# Patient Record
Sex: Male | Born: 1985
Health system: Southern US, Community
[De-identification: ages and names within clinical notes are randomized; demographics above are authoritative.]

## PROBLEM LIST (undated history)

## (undated) DIAGNOSIS — M272 Inflammatory conditions of jaws: Secondary | ICD-10-CM

## (undated) HISTORY — PX: NO PAST SURGERIES: SHX2092

---

## 2016-12-14 ENCOUNTER — Ambulatory Visit (HOSPITAL_COMMUNITY)
Admission: EM | Admit: 2016-12-14 | Discharge: 2016-12-14 | Disposition: A | Discharge status: Home / Self Care | Payer: Self-pay | Attending: Family Medicine | Admitting: Family Medicine

## 2016-12-14 ENCOUNTER — Encounter (HOSPITAL_COMMUNITY): Payer: Self-pay | Admitting: Emergency Medicine

## 2016-12-14 DIAGNOSIS — H9191 Unspecified hearing loss, right ear: Secondary | ICD-10-CM

## 2016-12-14 DIAGNOSIS — H938X1 Other specified disorders of right ear: Secondary | ICD-10-CM

## 2016-12-14 NOTE — Discharge Instructions (Signed)
The cause of your symptoms is not immediately clear. This may be due to dysfunction of the mechanical or nervous structures of the inner ear. This can only be properly evaluated by a primary care physician or ENT specialist. At your visit today there is no evidence of infection, fluid buildup, or ear wax in your ear. Please contact community health and wellness for a follow-up appointment.

## 2016-12-14 NOTE — ED Triage Notes (Signed)
Reports unable to hear in right ear for 2 months .reports a trip to [place] and noticing popping in ear, but since arriving back home -unable to hear in right ear

## 2016-12-14 NOTE — ED Provider Notes (Signed)
[facility]    CSN: [id number] Arrival date & time: 12/14/16  1422     History   Chief Complaint Chief Complaint  Patient presents with  . Ear Fullness    HPI Robert Kent is a 31 y.o. male.   HPI  Right ear fullness and diminished ability to hear. Constant. Getting worse. Present for 2-3 months. Nothing makes it better. Nothing makes it worse. Denies any pain, dizziness, nausea, vomiting, headache, other focal neurological deficit, neck stiffness, chest pain, palpitations, headache. No previous history of injury to the ear. No previous surgeries. No family history of deafness.    History reviewed. No pertinent past medical history.  There are no active problems to display for this patient.   History reviewed. No pertinent surgical history.     Home Medications    Prior to Admission medications   Not on File    Family History No family history on file.  Social History Social History  Substance Use Topics  . Smoking status: Current Every Day Smoker  . Smokeless tobacco: Not on file  . Alcohol use Yes     Allergies   Patient has no known allergies.   Review of Systems Review of Systems Per HPI with all other pertinent systems negative.    Physical Exam Triage Vital Signs ED Triage Vitals [12/14/16 1449]  Enc Vitals Group     BP 119/67     Pulse Rate 60     Resp 16     Temp 98.4 F (36.9 C)     Temp Source Oral     SpO2 97 %     Weight      Height      Head Circumference      Peak Flow      Pain Score      Pain Loc      Pain Edu?      Excl. in GC?    No data found.   Updated Vital Signs BP 119/67 (BP Location: Right Arm)   Pulse 60   Temp 98.4 F (36.9 C) (Oral)   Resp 16   SpO2 97%   Visual Acuity Right Eye Distance:   Left Eye Distance:   Bilateral Distance:    Right Eye Near:   Left Eye Near:    Bilateral Near:     Physical Exam  Physical Exam  Constitutional: oriented to person, place, and time.  appears well-developed and well-nourished. No distress.  HENT:  Head: Normocephalic and atraumatic.  tympanic membranes normal bilaterally. No effusions. Eyes: EOMI. PERRL.  Neck: Normal range of motion.  Cardiovascular: RRR, no m/r/g, 2+ distal pulses,  Pulmonary/Chest: Effort normal and breath sounds normal. No respiratory distress.  Abdominal: Soft. Bowel sounds are normal. NonTTP, no distension.  Musculoskeletal: Normal range of motion. Non ttp, no effusion.  Neurological: alert and oriented to person, place, and time.  attempted simple scratch test while patient's eyes were closed. Hearing slightly worse on the right on the left. Skin: Skin is warm. No rash noted. non diaphoretic.  Psychiatric: normal mood and affect. behavior is normal. Judgment and thought content normal.   UC Treatments / Results  Labs (all labs ordered are listed, but only abnormal results are displayed) Labs Reviewed - No data to display  EKG  EKG Interpretation None       Radiology No results found.  Procedures Procedures (including critical care time)  Medications Ordered in UC Medications - No data to display  Initial Impression / Assessment and Plan / UC Course  I have reviewed the triage vital signs and the nursing notes.  Pertinent labs & imaging results that were available during my care of the patient were reviewed by me and considered in my medical decision making (see chart for details).     Ear fullness and loss of hearing in the absence of infection, fluid buildup, or cerumen impaction concerning for mechanical or nervous system dysfunction. Unfortunately we do not have the capabilities of testing patient's hearing in order to get objective findings. Patient will need further testing other new PCPs office [facility] Works with social work to get patient financial assistance as he has no insurance. Patient will follow up at [facility] wellness and will hopefully be [facility]  patient to ENT for further testing if needed.  Final Clinical Impressions(s) / UC Diagnoses   Final diagnoses:  None    New Prescriptions New Prescriptions   No medications on file     Ozella Rocksavid J Merrell, MD 12/14/16 1526

## 2018-08-07 ENCOUNTER — Other Ambulatory Visit: Payer: Self-pay

## 2018-08-07 ENCOUNTER — Ambulatory Visit (HOSPITAL_COMMUNITY)
Admission: EM | Admit: 2018-08-07 | Discharge: 2018-08-07 | Disposition: A | Discharge status: Home / Self Care | Payer: Self-pay

## 2018-08-07 ENCOUNTER — Encounter (HOSPITAL_COMMUNITY): Payer: Self-pay | Admitting: Emergency Medicine

## 2018-08-07 ENCOUNTER — Inpatient Hospital Stay (HOSPITAL_COMMUNITY)
Admission: EM | Admit: 2018-08-07 | Discharge: 2018-08-10 | DRG: 158 | Disposition: A | Discharge status: Home / Self Care | Payer: Self-pay | Attending: Internal Medicine | Admitting: Internal Medicine

## 2018-08-07 ENCOUNTER — Encounter (HOSPITAL_COMMUNITY): Payer: Self-pay | Admitting: *Deleted

## 2018-08-07 ENCOUNTER — Emergency Department (HOSPITAL_COMMUNITY): Payer: Self-pay

## 2018-08-07 DIAGNOSIS — M272 Inflammatory conditions of jaws: Secondary | ICD-10-CM

## 2018-08-07 DIAGNOSIS — K0401 Reversible pulpitis: Secondary | ICD-10-CM | POA: Diagnosis present

## 2018-08-07 DIAGNOSIS — K122 Cellulitis and abscess of mouth: Secondary | ICD-10-CM | POA: Diagnosis present

## 2018-08-07 DIAGNOSIS — H9191 Unspecified hearing loss, right ear: Secondary | ICD-10-CM | POA: Diagnosis present

## 2018-08-07 DIAGNOSIS — K047 Periapical abscess without sinus: Principal | ICD-10-CM | POA: Diagnosis present

## 2018-08-07 DIAGNOSIS — F17210 Nicotine dependence, cigarettes, uncomplicated: Secondary | ICD-10-CM | POA: Diagnosis present

## 2018-08-07 HISTORY — DX: Inflammatory conditions of jaws: M27.2

## 2018-08-07 LAB — I-STAT CHEM 8, ED
BUN: 11 mg/dL (ref 6–20)
CREATININE: 0.9 mg/dL (ref 0.61–1.24)
Calcium, Ion: 1.2 mmol/L (ref 1.15–1.40)
Chloride: 98 mmol/L (ref 98–111)
Glucose, Bld: 113 mg/dL — ABNORMAL HIGH (ref 70–99)
HEMATOCRIT: 47 % (ref 39.0–52.0)
HEMOGLOBIN: 16 g/dL (ref 13.0–17.0)
Potassium: 3.8 mmol/L (ref 3.5–5.1)
Sodium: 137 mmol/L (ref 135–145)
TCO2: 29 mmol/L (ref 22–32)

## 2018-08-07 LAB — COMPREHENSIVE METABOLIC PANEL
ALT: 17 U/L (ref 0–44)
AST: 18 U/L (ref 15–41)
Albumin: 4.6 g/dL (ref 3.5–5.0)
Alkaline Phosphatase: 45 U/L (ref 38–126)
Anion gap: 11 (ref 5–15)
BUN: 10 mg/dL (ref 6–20)
CHLORIDE: 100 mmol/L (ref 98–111)
CO2: 25 mmol/L (ref 22–32)
Calcium: 9.6 mg/dL (ref 8.9–10.3)
Creatinine, Ser: 0.96 mg/dL (ref 0.61–1.24)
Glucose, Bld: 113 mg/dL — ABNORMAL HIGH (ref 70–99)
POTASSIUM: 3.8 mmol/L (ref 3.5–5.1)
Sodium: 136 mmol/L (ref 135–145)
Total Bilirubin: 1.5 mg/dL — ABNORMAL HIGH (ref 0.3–1.2)
Total Protein: 7.9 g/dL (ref 6.5–8.1)

## 2018-08-07 LAB — CBC WITH DIFFERENTIAL/PLATELET
Abs Immature Granulocytes: 0.05 10*3/uL (ref 0.00–0.07)
Basophils Absolute: 0 10*3/uL (ref 0.0–0.1)
Basophils Relative: 0 %
EOS ABS: 0.1 10*3/uL (ref 0.0–0.5)
EOS PCT: 1 %
HEMATOCRIT: 45.2 % (ref 39.0–52.0)
Hemoglobin: 13.7 g/dL (ref 13.0–17.0)
Immature Granulocytes: 0 %
LYMPHS ABS: 1.8 10*3/uL (ref 0.7–4.0)
Lymphocytes Relative: 15 %
MCH: 24.8 pg — AB (ref 26.0–34.0)
MCHC: 30.3 g/dL (ref 30.0–36.0)
MCV: 81.9 fL (ref 80.0–100.0)
Monocytes Absolute: 1.1 10*3/uL — ABNORMAL HIGH (ref 0.1–1.0)
Monocytes Relative: 9 %
NRBC: 0 % (ref 0.0–0.2)
Neutro Abs: 8.8 10*3/uL — ABNORMAL HIGH (ref 1.7–7.7)
Neutrophils Relative %: 75 %
PLATELETS: 108 10*3/uL — AB (ref 150–400)
RBC: 5.52 MIL/uL (ref 4.22–5.81)
RDW: 13.2 % (ref 11.5–15.5)
WBC: 11.9 10*3/uL — ABNORMAL HIGH (ref 4.0–10.5)

## 2018-08-07 LAB — INFLUENZA PANEL BY PCR (TYPE A & B)
INFLAPCR: NEGATIVE
INFLBPCR: NEGATIVE

## 2018-08-07 LAB — GROUP A STREP BY PCR: Group A Strep by PCR: NOT DETECTED

## 2018-08-07 LAB — I-STAT CG4 LACTIC ACID, ED: Lactic Acid, Venous: 1.02 mmol/L (ref 0.5–1.9)

## 2018-08-07 MED ORDER — IOHEXOL 300 MG/ML  SOLN
75.0000 mL | Freq: Once | INTRAMUSCULAR | Status: AC | PRN
  Administered 2018-08-07: 100 mL via INTRAVENOUS

## 2018-08-07 MED ORDER — LACTATED RINGERS IV SOLN
INTRAVENOUS | Status: DC
  Administered 2018-08-07 – 2018-08-09 (×4): via INTRAVENOUS

## 2018-08-07 MED ORDER — ONDANSETRON HCL 4 MG PO TABS: 4.0000 mg | ORAL_TABLET | Freq: Four times a day (QID) | ORAL | Status: DC | PRN

## 2018-08-07 MED ORDER — CLINDAMYCIN PHOSPHATE 600 MG/50ML IV SOLN
600.0000 mg | Freq: Four times a day (QID) | INTRAVENOUS | Status: DC
  Administered 2018-08-07 – 2018-08-10 (×11): 600 mg via INTRAVENOUS
  Filled 2018-08-07 (×14): qty 50

## 2018-08-07 MED ORDER — DEXAMETHASONE SODIUM PHOSPHATE 10 MG/ML IJ SOLN
10.0000 mg | Freq: Once | INTRAMUSCULAR | Status: AC
  Administered 2018-08-07: 10 mg via INTRAVENOUS
  Filled 2018-08-07: qty 1

## 2018-08-07 MED ORDER — ONDANSETRON HCL 4 MG/2ML IJ SOLN
4.0000 mg | Freq: Four times a day (QID) | INTRAMUSCULAR | Status: DC | PRN
  Administered 2018-08-09: 4 mg via INTRAVENOUS

## 2018-08-07 MED ORDER — ACETAMINOPHEN 325 MG PO TABS
650.0000 mg | ORAL_TABLET | Freq: Four times a day (QID) | ORAL | Status: DC | PRN
  Administered 2018-08-08 (×2): 650 mg via ORAL
  Filled 2018-08-07 (×2): qty 2

## 2018-08-07 MED ORDER — DEXAMETHASONE SODIUM PHOSPHATE 10 MG/ML IJ SOLN
10.0000 mg | Freq: Two times a day (BID) | INTRAMUSCULAR | Status: DC
  Administered 2018-08-07 – 2018-08-10 (×6): 10 mg via INTRAVENOUS
  Filled 2018-08-07 (×5): qty 1

## 2018-08-07 MED ORDER — MORPHINE SULFATE (PF) 2 MG/ML IV SOLN
2.0000 mg | INTRAVENOUS | Status: DC | PRN
  Administered 2018-08-08: 2 mg via INTRAVENOUS
  Filled 2018-08-07: qty 1

## 2018-08-07 MED ORDER — CLINDAMYCIN PHOSPHATE 600 MG/50ML IV SOLN
600.0000 mg | Freq: Once | INTRAVENOUS | Status: AC
  Administered 2018-08-07: 600 mg via INTRAVENOUS
  Filled 2018-08-07: qty 50

## 2018-08-07 MED ORDER — SODIUM CHLORIDE 0.9 % IV BOLUS
1000.0000 mL | Freq: Once | INTRAVENOUS | Status: AC
  Administered 2018-08-07: 1000 mL via INTRAVENOUS

## 2018-08-07 MED ORDER — KETOROLAC TROMETHAMINE 30 MG/ML IJ SOLN
30.0000 mg | Freq: Once | INTRAMUSCULAR | Status: AC
  Administered 2018-08-07: 30 mg via INTRAVENOUS
  Filled 2018-08-07: qty 1

## 2018-08-07 MED ORDER — ACETAMINOPHEN 650 MG RE SUPP: 650.0000 mg | Freq: Four times a day (QID) | RECTAL | Status: DC | PRN

## 2018-08-07 NOTE — ED Triage Notes (Signed)
Pt in c/o right lower jaw that started yesterday, pt reports difficulty maintaining his saliva and is spitting into a tissue, no distress noted

## 2018-08-07 NOTE — H&P (Signed)
History and Physical    Robert Kent ZOX:[id number] DOB: [birth year] DOA: 08/07/2018  PCP: Patient, No Pcp Per - last saw a doctor at [facility] >1 year ago Consultants:  None Patient coming from: Home - lives with parents and brother; NO[place], [phone]  Chief Complaint: Jaw pain  HPI: Robert Kent is a 32 y.o. male with no significant past medical history presenting with jaw pain.  He noticed right > left jaw pain yesterday.  He was perfectly fine prior.  No dental pain or swelling prior to yesterday.  He is currently unable to eat or drink due to the swelling and trismus.  No fever.  He has never had this issue before.   Last saw a dentist >5-6 years ago.  ED Course:  Sudden onset R face/neck swelling, trismus.  He is a refugee, uncertain of immunization status and so some concern for mumps - no longer worried about this.  He has jaw abscesses - given Decadron and Clindamycin.  Dr. Teola Bradley (oral surgery) to see.  Given Toradol for pain.  Review of Systems: As per HPI; otherwise review of systems reviewed and negative.   Ambulatory Status: Ambulates without assistance  History reviewed. No pertinent past medical history.  Past Surgical History:  Procedure Laterality Date  . NO PAST SURGERIES      Social History   Socioeconomic History  . Marital status: Single    Spouse name: Not on file  . Number of children: Not on file  . Years of education: Not on file  . Highest education level: Not on file  Occupational History  . Occupation: Management consultant Needs  . Financial resource strain: Not on file  . Food insecurity:    Worry: Not on file    Inability: Not on file  . Transportation needs:    Medical: Not on file    Non-medical: Not on file  Tobacco Use  . Smoking status: Current Every Day Smoker    Packs/day: 0.25    Years: 10.00    Pack years: 2.50    Types: Cigarettes  . Smokeless tobacco: Never Used  Substance and Sexual Activity  . Alcohol use: Yes   Comment: no daily drinking, drinks a few times a month  . Drug use: No  . Sexual activity: Not on file  Lifestyle  . Physical activity:    Days per week: Not on file    Minutes per session: Not on file  . Stress: Not on file  Relationships  . Social connections:    Talks on phone: Not on file    Gets together: Not on file    Attends religious service: Not on file    Active member of club or organization: Not on file    Attends meetings of clubs or organizations: Not on file    Relationship status: Not on file  . Intimate partner violence:    Fear of current or ex partner: Not on file    Emotionally abused: Not on file    Physically abused: Not on file    Forced sexual activity: Not on file  Other Topics Concern  . Not on file  Social History Narrative  . Not on file    No Known Allergies  Family History  Family history unknown: Yes    Prior to Admission medications   Medication Sig Start Date End Date Taking? Authorizing Provider  acetaminophen (TYLENOL) 500 MG tablet Take 1,000 mg by mouth every 6 (six) hours  as needed for mild pain.   Yes [provider]    Physical Exam: Vitals:   08/07/18 1338  BP: 105/65  Pulse: 61  Resp: 16  Temp: 97.8 F (36.6 C)  TempSrc: Oral  SpO2: 100%     General: Appears calm and comfortable and is NAD Eyes:  PERRL, EOMI, normal lids, iris ENT: left mandibular edema without erythema, significant TTP, shotty submandibular LAD; poor dentition.  He has trismus, with difficulty opening his mouth  Neck:  no LAD, masses or thyromegaly Cardiovascular:  RRR, no m/r/g. No LE edema.  Respiratory:   CTA bilaterally with no wheezes/rales/rhonchi.  Normal respiratory effort. Abdomen:  soft, NT, ND, NABS Skin:  no rash or induration seen on limited exam Musculoskeletal:  grossly normal tone BUE/BLE, good ROM, no bony abnormality Psychiatric:  grossly normal mood and affect, speech fluent and appropriate, AOx3 Neurologic:  CN 2-12  grossly intact, moves all extremities in coordinated fashion, sensation intact    Radiological Exams on Admission: Ct Soft Tissue Neck W Contrast  Result Date: 08/07/2018 CLINICAL DATA:  Pain and swelling in the right side of the neck. Symptoms began yesterday. EXAM: CT NECK WITH CONTRAST TECHNIQUE: Multidetector CT imaging of the neck was performed using the standard protocol following the bolus administration of intravenous contrast. CONTRAST:  OMNIPAQUE IOHEXOL 300 MG/ML  SOLN COMPARISON:  None. FINDINGS: Pharynx and larynx: No focal mucosal or submucosal lesions are present. Nasopharynx is within normal limits. Soft palate and tongue base are normal. Palatine tonsils are within normal limits. Epiglottis is normal. Hypopharynx is unremarkable. Vocal cords are midline and symmetric. Salivary glands: Edematous changes are present about the submandibular glands bilaterally, right greater than left. There is no duct obstruction or mass lesion. The parotid glands are unremarkable. Thyroid: Normal Lymph nodes: Right greater than left submandibular and level 2 adenopathy is reactive. No necrotic nodes are present. Vascular: Negative. Limited intracranial: Within normal limits. Mastoids and visualized paranasal sinuses: Circumferential mucosal thickening in the inferior maxillary sinuses is worse on the left. Paranasal sinuses are otherwise not imaged. Mastoid air cells are clear. Skeleton: Vertebral body heights alignment are maintained. No focal lytic or blastic lesions are present. Periapical lucencies are present about the medial incisors bilaterally in the mandible. There is a periapical lucency about the right lateral incisor with destruction of the cortex posterior to the apex of tooth 26. A subperiosteal abscess extends laterally and posteriorly from the apex of tooth 26 and the cortical breach. The subperiosteal abscess measures 17 x 18 x 5 mm. Upper chest: The lung apices are clear.  An azygos  fissure is noted. Other: Marked edematous changes are present in the right submandibular space displacing the mylohyoid medially. There is extensive subcutaneous edema and thickening of the platysma. The right sublingual gland is displaced medially. IMPRESSION: 1. Periapical abscesses involving 3 of the mandibular incisors. Please see discussion above. 2. Posterior cortical destruction and associated subperiosteal abscess extending along the medial aspect of the mandible anteriorly with extensive associated inflammatory changes below the floor of the mouth on the right and into the right submandibular space. 3. Inflammatory changes into the right neck of odontogenic etiology. 4. Reactive submandibular and level 2 adenopathy, right greater than left. Electronically Signed   By: Marin Roberts M.D.   On: 08/07/2018 12:25    EKG: not done   Labs on Admission: I have personally reviewed the available labs and imaging studies at the time of the admission.  Pertinent labs:  Glucose 113 Bili 1.5 CMP otherwise WNL WBC 11.9 Platelets 108  Assessment/Plan Principal Problem:   Acute periapical abscess   -Otherwise healthy refugee presenting with acute onset of R>L mandibular swelling and pain -Imaging indicates periapical abscesses with cortical destruction and extensive inflammatory changes, likely associated with poor dentition -Given the patient's significant pain, swelling, and trismus, he is unable to take any PO  -Will leave NPO for now and hydrate with IVF -Dr. Barbette Merino from oral surgery will consult on the patient this afternoon to determine what additional treatment is needed -For now, continue IV Clindamycin 600 mg q6h and 10 mg IV Decadron BID -Dr. Doran Heater (ENT) was consulted by telephone by the EDP but there does not appear to be a current role for ENT given his intact airway -Pain control with morphine -Will observe for now, but depending on the extent of treatment needed, he  may need to move to inpatient status   DVT prophylaxis: Early ambulation Code Status:  Full  Family Communication: Friend present during evaluation  Disposition Plan:  Home once clinically improved Consults called: Oral surgery; ENT (telephone only)  Admission status: It is my clinical opinion that referral for OBSERVATION is reasonable and necessary in this patient based on the above information provided. The aforementioned taken together are felt to place the patient at high risk for further clinical deterioration. However it is anticipated that the patient may be medically stable for discharge from the hospital within 24 to 48 hours.    Jonah Blue MD [facility]  If note is complete, please contact covering daytime or nighttime physician. [email or web address] Password [email or web address]  08/07/2018, 4:18 PM

## 2018-08-07 NOTE — ED Provider Notes (Signed)
[facility] EMERGENCY DEPARTMENT Provider Note   CSN: [id number] Arrival date & time: 08/07/18  1014     History   Chief Complaint Chief Complaint  Patient presents with  . Facial Swelling    HPI Robert Kent is a 32 y.o. male with no pertinent PMH who presents to the Emergency Department with a chief complaint of right-sided facial and neck swelling.   Patient endorses constant, rapidly worsening right-sided facial and neck swelling, onset yesterday with associated muffled voice and trismus.  He states that he feels as if he is having some difficulty breathing and starting to feel as if his throat is closing.  He reports associated subjective fever and chills.  He denies sore throat, otalgia, nasal congestion, nominal pain, nausea, vomiting, diarrhea, or dental pain.  Pain is worse with swallowing.  No alleviating factors.  Reports he has lived in a refugee camp in the [place] for the last 18 years.  He is originally from [place]. He is unsure of his vaccination status.  He states that his father was ill with similar symptoms about 1.5 months ago.  He is unsure of his father's final diagnoses, but he knows that he had an infection.  The history is provided by the patient. A language interpreter was used ([place]).    History reviewed. No pertinent past medical history.  There are no active problems to display for this patient.   Past Surgical History:  Procedure Laterality Date  . NO PAST SURGERIES        Home Medications    Prior to Admission medications   Medication Sig Start Date End Date Taking? Authorizing Provider  acetaminophen (TYLENOL) 500 MG tablet Take 1,000 mg by mouth every 6 (six) hours as needed for mild pain.   Yes [provider]    Family History Family History  Family history unknown: Yes    Social History Social History   Tobacco Use  . Smoking status: Current Every Day Smoker  Substance Use Topics  . Alcohol use:  Yes  . Drug use: No     Allergies   Patient has no known allergies.   Review of Systems Review of Systems  Constitutional: Positive for chills and fever. Negative for appetite change.  HENT: Positive for facial swelling, sore throat, trouble swallowing and voice change. Negative for congestion, dental problem, ear pain, postnasal drip, sinus pressure and sinus pain.   Respiratory: Negative for shortness of breath.   Cardiovascular: Negative for chest pain.  Gastrointestinal: Negative for abdominal pain, diarrhea, nausea and vomiting.  Genitourinary: Negative for dysuria.  Musculoskeletal: Negative for back pain.  Skin: Negative for rash.  Allergic/Immunologic: Negative for immunocompromised state.  Neurological: Negative for dizziness, weakness, numbness and headaches.  Psychiatric/Behavioral: Negative for confusion.   Physical Exam Updated Vital Signs BP 105/65   Pulse 61   Temp 97.8 F (36.6 C) (Oral)   Resp 16   SpO2 100%   Physical Exam  Constitutional: He appears well-developed.  HENT:  Head: Normocephalic and atraumatic.  Right Ear: External ear normal.  Left Ear: External ear normal.  Mouth/Throat: There is trismus in the jaw.  Significant sublingual induration and swelling. TTP to the sublingual area. Right-sided swelling to the right lateral superior neck and mandible. Posterior oropharynx is unable to be visualized due to swelling. No tri-podding. No edema to the mandibular gingiva.   Eyes: Conjunctivae are normal.  Neck: Neck supple.  Cardiovascular: Normal rate, regular rhythm, normal heart sounds  and intact distal pulses. Exam reveals no gallop and no friction rub.  No murmur heard. Pulmonary/Chest: Effort normal. No stridor. No respiratory distress. He has no wheezes. He has no rales. He exhibits no tenderness.  Abdominal: Soft. He exhibits no distension.  Neurological: He is alert.  Skin: Skin is warm and dry.  Psychiatric: His behavior is normal.    Nursing note and vitals reviewed.  ED Treatments / Results  Labs (all labs ordered are listed, but only abnormal results are displayed) Labs Reviewed  CBC WITH DIFFERENTIAL/PLATELET - Abnormal; Notable for the following components:      Result Value   WBC 11.9 (*)    MCH 24.8 (*)    Platelets 108 (*)    Neutro Abs 8.8 (*)    Monocytes Absolute 1.1 (*)    All other components within normal limits  COMPREHENSIVE METABOLIC PANEL - Abnormal; Notable for the following components:   Glucose, Bld 113 (*)    Total Bilirubin 1.5 (*)    All other components within normal limits  I-STAT CHEM 8, ED - Abnormal; Notable for the following components:   Glucose, Bld 113 (*)    All other components within normal limits  GROUP A STREP BY PCR  INFLUENZA PANEL BY PCR (TYPE A & B)  I-STAT CG4 LACTIC ACID, ED  I-STAT CG4 LACTIC ACID, ED    EKG None  Radiology Ct Soft Tissue Neck W Contrast  Result Date: 08/07/2018 CLINICAL DATA:  Pain and swelling in the right side of the neck. Symptoms began yesterday. EXAM: CT NECK WITH CONTRAST TECHNIQUE: Multidetector CT imaging of the neck was performed using the standard protocol following the bolus administration of intravenous contrast. CONTRAST:  OMNIPAQUE IOHEXOL 300 MG/ML  SOLN COMPARISON:  None. FINDINGS: Pharynx and larynx: No focal mucosal or submucosal lesions are present. Nasopharynx is within normal limits. Soft palate and tongue base are normal. Palatine tonsils are within normal limits. Epiglottis is normal. Hypopharynx is unremarkable. Vocal cords are midline and symmetric. Salivary glands: Edematous changes are present about the submandibular glands bilaterally, right greater than left. There is no duct obstruction or mass lesion. The parotid glands are unremarkable. Thyroid: Normal Lymph nodes: Right greater than left submandibular and level 2 adenopathy is reactive. No necrotic nodes are present. Vascular: Negative. Limited intracranial:  Within normal limits. Mastoids and visualized paranasal sinuses: Circumferential mucosal thickening in the inferior maxillary sinuses is worse on the left. Paranasal sinuses are otherwise not imaged. Mastoid air cells are clear. Skeleton: Vertebral body heights alignment are maintained. No focal lytic or blastic lesions are present. Periapical lucencies are present about the medial incisors bilaterally in the mandible. There is a periapical lucency about the right lateral incisor with destruction of the cortex posterior to the apex of tooth 26. A subperiosteal abscess extends laterally and posteriorly from the apex of tooth 26 and the cortical breach. The subperiosteal abscess measures 17 x 18 x 5 mm. Upper chest: The lung apices are clear.  An azygos fissure is noted. Other: Marked edematous changes are present in the right submandibular space displacing the mylohyoid medially. There is extensive subcutaneous edema and thickening of the platysma. The right sublingual gland is displaced medially. IMPRESSION: 1. Periapical abscesses involving 3 of the mandibular incisors. Please see discussion above. 2. Posterior cortical destruction and associated subperiosteal abscess extending along the medial aspect of the mandible anteriorly with extensive associated inflammatory changes below the floor of the mouth on the right and into the  right submandibular space. 3. Inflammatory changes into the right neck of odontogenic etiology. 4. Reactive submandibular and level 2 adenopathy, right greater than left. Electronically Signed   By: Marin Roberts M.D.   On: 08/07/2018 12:25    Procedures Procedures (including critical care time)  Medications Ordered in ED Medications  dexamethasone (DECADRON) injection 10 mg (10 mg Intravenous Given 08/07/18 1049)  sodium chloride 0.9 % bolus 1,000 mL (0 mLs Intravenous Stopped 08/07/18 1210)  iohexol (OMNIPAQUE) 300 MG/ML solution 75 mL (100 mLs Intravenous Contrast Given  08/07/18 1153)  clindamycin (CLEOCIN) IVPB 600 mg (600 mg Intravenous New Bag/Given 08/07/18 1336)  ketorolac (TORADOL) 30 MG/ML injection 30 mg (30 mg Intravenous Given 08/07/18 1347)     Initial Impression / Assessment and Plan / ED Course  I have reviewed the triage vital signs and the nursing notes.  Pertinent labs & imaging results that were available during my care of the patient were reviewed by me and considered in my medical decision making (see chart for details).     32 year old male with no pertinent past medical history and questionable vaccination status presenting with right-sided facial and neck swelling, trismus, and muffled voice, onset yesterday.  On exam, posterior oropharynx is able to be visualized at this time secondary to swelling.  There is significant swelling to the sub-lingual region.  Initially, there was concern given the patient's questionable immunization status with unilateral swelling but the patient may have months since the patient's father was ill with unilateral facial swelling in the last 1 to 1-1/2 months.  Spoke with Darral Dash at infection control who recommended an influenza swab and strep test.  If negative, she recommended a buccal swab for mumps PCR.  Since his symptoms are less than 3 days, IgG or IgM is not indicated.  However, after CT scan mumps was much less likely.  Labs are notable for leukocytosis of 12 with neutrophil count of 8.8.  Monocyte count is also 1.1.  Thrombocytopenia of 108.    CT neck with periapical abscesses involving 3 of the mandibular incisors in addition to posterior cortical destruction and associated subperiosteal abscess extending along the medial aspect of the mandible anteriorly with extensive associated inflammatory changes below the floor the mouth on the right and into the right submandibular space and inflammatory changes into the right neck appears to be a odontogenic etiology.  Decadron, Toradol, and IV clindamycin  given in the ED.  The patient was discussed with Dr. Dalene Seltzer, attending physician.  Initially spoke with Dr. Doran Heater, ENT, who does not feel that ENT involvement is needed in this time as the patient does not require intubation.  Spoke with Dr. Laural Benes, oral surgery, who will plan to evaluate the patient in the hospital this afternoon.  Consulted the medical team, and Dr. Ophelia Charter, hospitalist, will admit. The patient appears reasonably stabilized for admission considering the current resources, flow, and capabilities available in the ED at this time, and I doubt any other [facility] requiring further screening and/or treatment in the ED prior to admission.  Final Clinical Impressions(s) / ED Diagnoses   Final diagnoses:  Subperiosteal abscess of jaw    ED Discharge Orders    None       Barkley Boards, PA-C 08/07/18 1518    Alvira Monday, MD 08/07/18 2335

## 2018-08-07 NOTE — ED Notes (Signed)
Traci, np evaluated patient at triage.  Patient going to ed now with family

## 2018-08-07 NOTE — ED Notes (Signed)
Report to 5 C 

## 2018-08-07 NOTE — ED Triage Notes (Addendum)
Swelling started yesterday.  Patient reports difficulty swallowing.  Breathing without difficulty.  Visible swelling to right side of neck and jaw.  Patient having difficulty opening his mouth.  Patient answering appropriately.  Patient having difficulty swallowing saliva, he is spitting saliva in tissue

## 2018-08-08 ENCOUNTER — Observation Stay (HOSPITAL_COMMUNITY): Payer: Self-pay

## 2018-08-08 DIAGNOSIS — K047 Periapical abscess without sinus: Secondary | ICD-10-CM | POA: Diagnosis present

## 2018-08-08 LAB — BASIC METABOLIC PANEL
ANION GAP: 9 (ref 5–15)
BUN: 13 mg/dL (ref 6–20)
CHLORIDE: 107 mmol/L (ref 98–111)
CO2: 24 mmol/L (ref 22–32)
Calcium: 9.2 mg/dL (ref 8.9–10.3)
Creatinine, Ser: 0.73 mg/dL (ref 0.61–1.24)
GFR calc Af Amer: >60 mL/min (ref >60)
GFR calc non Af Amer: >60 mL/min (ref >60)
GLUCOSE: 137 mg/dL — AB (ref 70–99)
POTASSIUM: 4 mmol/L (ref 3.5–5.1)
Sodium: 140 mmol/L (ref 135–145)

## 2018-08-08 LAB — CBC
HEMATOCRIT: 38.3 % — AB (ref 39.0–52.0)
HEMOGLOBIN: 12 g/dL — AB (ref 13.0–17.0)
MCH: 25.3 pg — AB (ref 26.0–34.0)
MCHC: 31.3 g/dL (ref 30.0–36.0)
MCV: 80.8 fL (ref 80.0–100.0)
Platelets: 99 10*3/uL — ABNORMAL LOW (ref 150–400)
RBC: 4.74 MIL/uL (ref 4.22–5.81)
RDW: 13.2 % (ref 11.5–15.5)
WBC: 11.9 10*3/uL — ABNORMAL HIGH (ref 4.0–10.5)
nRBC: 0 % (ref 0.0–0.2)

## 2018-08-08 LAB — SURGICAL PCR SCREEN
MRSA, PCR: NEGATIVE
Staphylococcus aureus: POSITIVE — AB

## 2018-08-08 LAB — HIV ANTIBODY (ROUTINE TESTING W REFLEX): HIV Screen 4th Generation wRfx: NONREACTIVE

## 2018-08-08 MED ORDER — CEFAZOLIN SODIUM-DEXTROSE 2-4 GM/100ML-% IV SOLN
2.0000 g | INTRAVENOUS | Status: DC
  Filled 2018-08-08 (×3): qty 100

## 2018-08-08 NOTE — Progress Notes (Signed)
Progress Note    Robert Kent  ZOX:[id number] DOB: 05-12-1986  DOA: 08/07/2018 PCP: Patient, No Pcp Per    Brief Narrative:     Medical records reviewed and are as summarized below:  Robert Kent is an 32 y.o. male  with no significant past medical history presenting with jaw pain.  He noticed right > left jaw pain yesterday.  He was perfectly fine prior.  No dental pain or swelling prior to yesterday.  He is currently unable to eat or drink due to the swelling and trismus.  No fever.  He has never had this issue before.   Last saw a dentist >5-6 years ago.  Assessment/Plan:   Principal Problem:   Acute periapical abscess   Acute periapical abscess -Otherwise healthy refugee presenting with acute onset of R>L mandibular swelling and pain -Imaging indicates periapical abscesses with cortical destruction and extensive inflammatory changes, likely associated with poor dentition -Dr. Barbette Merino from oral surgery consult appreciated -For now, continue IV Clindamycin 600 mg q6h and 10 mg IV Decadron BID -Dr. Doran Heater (ENT) was consulted by telephone by the EDP but there does not appear to be a current role for ENT given his intact airway -interestingly patient 1 year ago had sudden decreased hearing in the right ear     Family Communication/Anticipated D/C date and plan/Code Status   DVT prophylaxis: ambulation Code Status: Full Code.  Family Communication: at bedside Disposition Plan: pending oral surgeon recommendations   Medical Consultants:    Oral surgery  Subjective:   Swelling improved with steroids and abx  Objective:    Vitals:   08/07/18 1338 08/07/18 2140 08/07/18 2352 08/08/18 0531  BP: 105/65 107/68 100/62 (!) 104/56  Pulse: 61 67 70 70  Resp: 16 16 18 16   Temp: 97.8 F (36.6 C) 98.7 F (37.1 C) 98.5 F (36.9 C) 97.6 F (36.4 C)  TempSrc: Oral Oral Oral Oral  SpO2: 100% 99% 100% 100%    Intake/Output Summary (Last 24 hours) at  08/08/2018 1353 Last data filed at 08/08/2018 0751 Gross per 24 hour  Intake 1275 ml  Output 1 ml  Net 1274 ml   There were no vitals filed for this visit.  Exam: In bed, NAD- poor dentition   Data Reviewed:   I have personally reviewed following labs and imaging studies:  Labs: Labs show the following:   Basic Metabolic Panel: Recent Labs  Lab 08/07/18 1041 08/07/18 1055 08/08/18 0336  NA 136 137 140  K 3.8 3.8 4.0  CL 100 98 107  CO2 25  --  24  GLUCOSE 113* 113* 137*  BUN 10 11 13   CREATININE 0.96 0.90 0.73  CALCIUM 9.6  --  9.2   GFR CrCl cannot be calculated (Unknown ideal weight.). Liver Function Tests: Recent Labs  Lab 08/07/18 1041  AST 18  ALT 17  ALKPHOS 45  BILITOT 1.5*  PROT 7.9  ALBUMIN 4.6   No results for input(s): LIPASE, AMYLASE in the last 168 hours. No results for input(s): AMMONIA in the last 168 hours. Coagulation profile No results for input(s): INR, PROTIME in the last 168 hours.  CBC: Recent Labs  Lab 08/07/18 1041 08/07/18 1055 08/08/18 0336  WBC 11.9*  --  11.9*  NEUTROABS 8.8*  --   --   HGB 13.7 16.0 12.0*  HCT 45.2 47.0 38.3*  MCV 81.9  --  80.8  PLT 108*  --  99*   Cardiac Enzymes: No results  for input(s): CKTOTAL, CKMB, CKMBINDEX, TROPONINI in the last 168 hours. BNP (last 3 results) No results for input(s): PROBNP in the last 8760 hours. CBG: No results for input(s): GLUCAP in the last 168 hours. D-Dimer: No results for input(s): DDIMER in the last 72 hours. Hgb A1c: No results for input(s): HGBA1C in the last 72 hours. Lipid Profile: No results for input(s): CHOL, HDL, LDLCALC, TRIG, CHOLHDL, LDLDIRECT in the last 72 hours. Thyroid function studies: No results for input(s): TSH, T4TOTAL, T3FREE, THYROIDAB in the last 72 hours.  Invalid input(s): FREET3 Anemia work up: No results for input(s): VITAMINB12, FOLATE, FERRITIN, TIBC, IRON, RETICCTPCT in the last 72 hours. Sepsis Labs: Recent Labs  Lab  08/07/18 1041 08/07/18 1055 08/08/18 0336  WBC 11.9*  --  11.9*  LATICACIDVEN  --  1.02  --     Microbiology Recent Results (from the past 240 hour(s))  Group A Strep by PCR     Status: None   Collection Time: 08/07/18 12:10 PM  Result Value Ref Range Status   Group A Strep by PCR NOT DETECTED NOT DETECTED Final    Comment: Performed at [facility], [address], [place], [place] [address]    Procedures and diagnostic studies:  Dg Orthopantogram  Result Date: 08/08/2018 CLINICAL DATA:  Right lower jaw pain.  Dental abscess. EXAM: ORTHOPANTOGRAM/PANORAMIC COMPARISON:  CT scan of August 07, 2018. FINDINGS: There is noted lucency involving at least 2 incisors in the mandible anteriorly. This corresponds to findings on CT scan suggesting periapical abscesses. No fracture or other abnormality is noted within the visualized mandible. IMPRESSION: Lucency involving the roots of at least 2 incisors anteriorly in the mandible corresponding to findings of prior CT scan. Electronically Signed   By: Lupita Raider, M.D.   On: 08/08/2018 09:56   Ct Soft Tissue Neck W Contrast  Result Date: 08/07/2018 CLINICAL DATA:  Pain and swelling in the right side of the neck. Symptoms began yesterday. EXAM: CT NECK WITH CONTRAST TECHNIQUE: Multidetector CT imaging of the neck was performed using the standard protocol following the bolus administration of intravenous contrast. CONTRAST:  OMNIPAQUE IOHEXOL 300 MG/ML  SOLN COMPARISON:  None. FINDINGS: Pharynx and larynx: No focal mucosal or submucosal lesions are present. Nasopharynx is within normal limits. Soft palate and tongue base are normal. Palatine tonsils are within normal limits. Epiglottis is normal. Hypopharynx is unremarkable. Vocal cords are midline and symmetric. Salivary glands: Edematous changes are present about the submandibular glands bilaterally, right greater than left. There is no duct obstruction or mass lesion. The parotid  glands are unremarkable. Thyroid: Normal Lymph nodes: Right greater than left submandibular and level 2 adenopathy is reactive. No necrotic nodes are present. Vascular: Negative. Limited intracranial: Within normal limits. Mastoids and visualized paranasal sinuses: Circumferential mucosal thickening in the inferior maxillary sinuses is worse on the left. Paranasal sinuses are otherwise not imaged. Mastoid air cells are clear. Skeleton: Vertebral body heights alignment are maintained. No focal lytic or blastic lesions are present. Periapical lucencies are present about the medial incisors bilaterally in the mandible. There is a periapical lucency about the right lateral incisor with destruction of the cortex posterior to the apex of tooth 26. A subperiosteal abscess extends laterally and posteriorly from the apex of tooth 26 and the cortical breach. The subperiosteal abscess measures 17 x 18 x 5 mm. Upper chest: The lung apices are clear.  An azygos fissure is noted. Other: Marked edematous changes are present  in the right submandibular space displacing the mylohyoid medially. There is extensive subcutaneous edema and thickening of the platysma. The right sublingual gland is displaced medially. IMPRESSION: 1. Periapical abscesses involving 3 of the mandibular incisors. Please see discussion above. 2. Posterior cortical destruction and associated subperiosteal abscess extending along the medial aspect of the mandible anteriorly with extensive associated inflammatory changes below the floor of the mouth on the right and into the right submandibular space. 3. Inflammatory changes into the right neck of odontogenic etiology. 4. Reactive submandibular and level 2 adenopathy, right greater than left. Electronically Signed   By: Marin Roberts M.D.   On: 08/07/2018 12:25    Medications:   . dexamethasone  10 mg Intravenous Q12H   Continuous Infusions: . clindamycin (CLEOCIN) IV 600 mg (08/08/18 1217)  .  lactated ringers 125 mL/hr at 08/08/18 0402     LOS: 0 days   Joseph Art  [facility]   *Please refer to [email or web address], password [email or web address] to get updated schedule on who will round on this patient, as hospitalists switch teams weekly. If 7PM-7AM, please contact night-coverage at [email or web address], password [email or web address] for any overnight needs.  08/08/2018, 1:53 PM

## 2018-08-08 NOTE — Progress Notes (Signed)
Robert Kent PROGRESS NOTE:   SUBJECTIVE: feels better  OBJECTIVE:  Vitals: Blood pressure (!) 104/56, pulse 70, temperature 97.6 F (36.4 C), temperature source Oral, resp. rate 16, SpO2 100 %. Lab results: Results for orders placed or performed during the hospital encounter of 08/07/18 (from the past 24 hour(s))  HIV antibody (Routine Testing)     Status: None   Collection Time: 08/07/18  3:57 PM  Result Value Ref Range   HIV Screen 4th Generation wRfx Non Reactive Non Reactive  Basic metabolic panel     Status: Abnormal   Collection Time: 08/08/18  3:36 AM  Result Value Ref Range   Sodium 140 135 - 145 mmol/L   Potassium 4.0 3.5 - 5.1 mmol/L   Chloride 107 98 - 111 mmol/L   CO2 24 22 - 32 mmol/L   Glucose, Bld 137 (H) 70 - 99 mg/dL   BUN 13 6 - 20 mg/dL   Creatinine, Ser 1.61 0.61 - 1.24 mg/dL   Calcium 9.2 8.9 - 09.6 mg/dL   GFR calc non Af Amer >60 >60 mL/min   GFR calc Af Amer >60 >60 mL/min   Anion gap 9 5 - 15  CBC     Status: Abnormal   Collection Time: 08/08/18  3:36 AM  Result Value Ref Range   WBC 11.9 (H) 4.0 - 10.5 K/uL   RBC 4.74 4.22 - 5.81 MIL/uL   Hemoglobin 12.0 (L) 13.0 - 17.0 g/dL   HCT 04.5 (L) 40.9 - 81.1 %   MCV 80.8 80.0 - 100.0 fL   MCH 25.3 (L) 26.0 - 34.0 pg   MCHC 31.3 30.0 - 36.0 g/dL   RDW 91.4 78.2 - 95.6 %   Platelets 99 (L) 150 - 400 K/uL   nRBC 0.0 0.0 - 0.2 %   Radiology Results: Dg Orthopantogram  Result Date: 08/08/2018 CLINICAL DATA:  Right lower jaw pain.  Dental abscess. EXAM: ORTHOPANTOGRAM/PANORAMIC COMPARISON:  CT scan of August 07, 2018. FINDINGS: There is noted lucency involving at least 2 incisors in the mandible anteriorly. This corresponds to findings on CT scan suggesting periapical abscesses. No fracture or other abnormality is noted within the visualized mandible. IMPRESSION: Lucency involving the roots of at least 2 incisors anteriorly in the mandible corresponding to findings of prior CT scan. Electronically  Signed   By: Lupita Raider, M.D.   On: 08/08/2018 09:56   Ct Soft Tissue Neck W Contrast  Result Date: 08/07/2018 CLINICAL DATA:  Pain and swelling in the right side of the neck. Symptoms began yesterday. EXAM: CT NECK WITH CONTRAST TECHNIQUE: Multidetector CT imaging of the neck was performed using the standard protocol following the bolus administration of intravenous contrast. CONTRAST:  OMNIPAQUE IOHEXOL 300 MG/ML  SOLN COMPARISON:  None. FINDINGS: Pharynx and larynx: No focal mucosal or submucosal lesions are present. Nasopharynx is within normal limits. Soft palate and tongue base are normal. Palatine tonsils are within normal limits. Epiglottis is normal. Hypopharynx is unremarkable. Vocal cords are midline and symmetric. Salivary glands: Edematous changes are present about the submandibular glands bilaterally, right greater than left. There is no duct obstruction or mass lesion. The parotid glands are unremarkable. Thyroid: Normal Lymph nodes: Right greater than left submandibular and level 2 adenopathy is reactive. No necrotic nodes are present. Vascular: Negative. Limited intracranial: Within normal limits. Mastoids and visualized paranasal sinuses: Circumferential mucosal thickening in the inferior maxillary sinuses is worse on the left. Paranasal sinuses are otherwise not imaged. Mastoid  air cells are clear. Skeleton: Vertebral body heights alignment are maintained. No focal lytic or blastic lesions are present. Periapical lucencies are present about the medial incisors bilaterally in the mandible. There is a periapical lucency about the right lateral incisor with destruction of the cortex posterior to the apex of tooth 26. A subperiosteal abscess extends laterally and posteriorly from the apex of tooth 26 and the cortical breach. The subperiosteal abscess measures 17 x 18 x 5 mm. Upper chest: The lung apices are clear.  An azygos fissure is noted. Other: Marked edematous changes are present  in the right submandibular space displacing the mylohyoid medially. There is extensive subcutaneous edema and thickening of the platysma. The right sublingual gland is displaced medially. IMPRESSION: 1. Periapical abscesses involving 3 of the mandibular incisors. Please see discussion above. 2. Posterior cortical destruction and associated subperiosteal abscess extending along the medial aspect of the mandible anteriorly with extensive associated inflammatory changes below the floor of the mouth on the right and into the right submandibular space. 3. Inflammatory changes into the right neck of odontogenic etiology. 4. Reactive submandibular and level 2 adenopathy, right greater than left. Electronically Signed   By: Marin Roberts M.D.   On: 08/07/2018 12:25   General appearance: alert and cooperative Head: Normocephalic, without obvious abnormality, atraumatic Eyes: negative Throat: moderate edema anterior floor of mouth right Neck: no adenopathy, supple, symmetrical, trachea midline, thyroid not enlarged, symmetric, no tenderness/mass/nodules and mild submandibular edema right  ASSESSMENT: Patient has abscessed teeth 24, 25, 26 with sublingual space infection. Discussed removal teeth with incision and drainage. )Patient does not want extraction of teeth at this time.   PLAN: Incision and drainage right sublingual space infection. GA Tomorrow. Patient to see general dentist for root canal therapy after discharge.   Robert Kent 08/08/2018

## 2018-08-08 NOTE — Consult Note (Signed)
Reason for Consult:jaw swelling Referring Physician: Karmen Bongo, MD  Robert Kent is an 32 y.o. male.  UJ:[id number] and pain right jaw   HPI: Right jaw pain and swelling began 08/06/2018. Denies fevers. Doesn't have regular dentist. Difficulty opening jaw  Past Medical History:  Diagnosis Date  . Subperiosteal abscess of jaw 08/07/2018    Past Surgical History:  Procedure Laterality Date  . NO PAST SURGERIES      Family History  Family history unknown: Yes    Social History:  reports that he has been smoking cigarettes. He has a 2.50 pack-year smoking history. He has never used smokeless tobacco. He reports that he drinks alcohol. He reports that he does not use drugs.  Allergies: No Known Allergies  Medications: I have reviewed the patient's current medications.  Results for orders placed or performed during the hospital encounter of 08/07/18 (from the past 48 hour(s))  CBC with Differential     Status: Abnormal   Collection Time: 08/07/18 10:41 AM  Result Value Ref Range   WBC 11.9 (H) 4.0 - 10.5 K/uL    Comment: WHITE COUNT CONFIRMED ON SMEAR   RBC 5.52 4.22 - 5.81 MIL/uL   Hemoglobin 13.7 13.0 - 17.0 g/dL   HCT 45.2 39.0 - 52.0 %   MCV 81.9 80.0 - 100.0 fL   MCH 24.8 (L) 26.0 - 34.0 pg   MCHC 30.3 30.0 - 36.0 g/dL   RDW 13.2 11.5 - 15.5 %   Platelets 108 (L) 150 - 400 K/uL    Comment: REPEATED TO VERIFY PLATELET COUNT CONFIRMED BY SMEAR Immature Platelet Fraction may be clinically indicated, consider ordering this additional test [id number]    nRBC 0.0 0.0 - 0.2 %   Neutrophils Relative % 75 %   Neutro Abs 8.8 (H) 1.7 - 7.7 K/uL   Lymphocytes Relative 15 %   Lymphs Abs 1.8 0.7 - 4.0 K/uL   Monocytes Relative 9 %   Monocytes Absolute 1.1 (H) 0.1 - 1.0 K/uL   Eosinophils Relative 1 %   Eosinophils Absolute 0.1 0.0 - 0.5 K/uL   Basophils Relative 0 %   Basophils Absolute 0.0 0.0 - 0.1 K/uL   Immature Granulocytes 0 %   Abs Immature Granulocytes 0.05  0.00 - 0.07 K/uL    Comment: Performed at [facility], [address] [place], [place] [address]  Comprehensive metabolic panel     Status: Abnormal   Collection Time: 08/07/18 10:41 AM  Result Value Ref Range   Sodium 136 135 - 145 mmol/L   Potassium 3.8 3.5 - 5.1 mmol/L   Chloride 100 98 - 111 mmol/L   CO2 25 22 - 32 mmol/L   Glucose, Bld 113 (H) 70 - 99 mg/dL   BUN 10 6 - 20 mg/dL   Creatinine, Ser 0.96 0.61 - 1.24 mg/dL   Calcium 9.6 8.9 - 10.3 mg/dL   Total Protein 7.9 6.5 - 8.1 g/dL   Albumin 4.6 3.5 - 5.0 g/dL   AST 18 15 - 41 U/L   ALT 17 0 - 44 U/L   Alkaline Phosphatase 45 38 - 126 U/L   Total Bilirubin 1.5 (H) 0.3 - 1.2 mg/dL   GFR calc non Af Amer >60 >60 mL/min   GFR calc Af Amer >60 >60 mL/min    Comment: (NOTE) The eGFR has been calculated using the CKD EPI equation. This calculation has not been validated in all clinical situations. eGFR's persistently <60 mL/min signify possible Chronic  Kidney Disease.    Anion gap 11 5 - 15    Comment: Performed at [facility] [address], [place], [place] [address]  I-Stat Chem 8, ED     Status: Abnormal   Collection Time: 08/07/18 10:55 AM  Result Value Ref Range   Sodium 137 135 - 145 mmol/L   Potassium 3.8 3.5 - 5.1 mmol/L   Chloride 98 98 - 111 mmol/L   BUN 11 6 - 20 mg/dL   Creatinine, Ser 0.90 0.61 - 1.24 mg/dL   Glucose, Bld 113 (H) 70 - 99 mg/dL   Calcium, Ion 1.20 1.15 - 1.40 mmol/L   TCO2 29 22 - 32 mmol/L   Hemoglobin 16.0 13.0 - 17.0 g/dL   HCT 47.0 39.0 - 52.0 %  I-Stat CG4 Lactic Acid, ED     Status: None   Collection Time: 08/07/18 10:55 AM  Result Value Ref Range   Lactic Acid, Venous 1.02 0.5 - 1.9 mmol/L  Influenza panel by PCR (type A & B)     Status: None   Collection Time: 08/07/18 12:10 PM  Result Value Ref Range   Influenza A By PCR NEGATIVE NEGATIVE   Influenza B By PCR NEGATIVE NEGATIVE    Comment: (NOTE) The Xpert Xpress Flu assay is intended as an aid in the diagnosis  of  influenza and should not be used as a sole basis for treatment.  This  assay is FDA approved for nasopharyngeal swab specimens only. Nasal  washings and aspirates are unacceptable for Xpert Xpress Flu testing. Performed at [facility], [place] [address] [place], [place] [address]   Group A Strep by PCR     Status: None   Collection Time: 08/07/18 12:10 PM  Result Value Ref Range   Group A Strep by PCR NOT DETECTED NOT DETECTED    Comment: Performed at [facility], [address] [place], [place] [address]  Basic metabolic panel     Status: Abnormal   Collection Time: 08/08/18  3:36 AM  Result Value Ref Range   Sodium 140 135 - 145 mmol/L   Potassium 4.0 3.5 - 5.1 mmol/L   Chloride 107 98 - 111 mmol/L   CO2 24 22 - 32 mmol/L   Glucose, Bld 137 (H) 70 - 99 mg/dL   BUN 13 6 - 20 mg/dL   Creatinine, Ser 0.73 0.61 - 1.24 mg/dL   Calcium 9.2 8.9 - 10.3 mg/dL   GFR calc non Af Amer >60 >60 mL/min   GFR calc Af Amer >60 >60 mL/min    Comment: (NOTE) The eGFR has been calculated using the CKD EPI equation. This calculation has not been validated in all clinical situations. eGFR's persistently <60 mL/min signify possible Chronic Kidney Disease.    Anion gap 9 5 - 15    Comment: Performed at [facility] [address], [place], [place] [address]  CBC     Status: Abnormal   Collection Time: 08/08/18  3:36 AM  Result Value Ref Range   WBC 11.9 (H) 4.0 - 10.5 K/uL   RBC 4.74 4.22 - 5.81 MIL/uL   Hemoglobin 12.0 (L) 13.0 - 17.0 g/dL    Comment: DELTA CHECK NOTED REPEATED TO VERIFY    HCT 38.3 (L) 39.0 - 52.0 %   MCV 80.8 80.0 - 100.0 fL   MCH 25.3 (L) 26.0 - 34.0 pg   MCHC 31.3 30.0 - 36.0 g/dL   RDW 13.2  11.5 - 15.5 %   Platelets 99 (L) 150 - 400 K/uL    Comment: Immature Platelet Fraction may be clinically indicated, consider ordering this additional test [id number] CONSISTENT WITH PREVIOUS RESULT    nRBC 0.0 0.0 - 0.2 %    Comment: Performed at [facility], [place] [address] [place], [place] [address]    Ct Soft Tissue Neck W Contrast  Result Date: 08/07/2018 CLINICAL DATA:  Pain and swelling in the right side of the neck. Symptoms began yesterday. EXAM: CT NECK WITH CONTRAST TECHNIQUE: Multidetector CT imaging of the neck was performed using the standard protocol following the bolus administration of intravenous contrast. CONTRAST:  154m OMNIPAQUE IOHEXOL 300 MG/ML  SOLN COMPARISON:  None. FINDINGS: Pharynx and larynx: No focal mucosal or submucosal lesions are present. Nasopharynx is within normal limits. Soft palate and tongue base are normal. Palatine tonsils are within normal limits. Epiglottis is normal. Hypopharynx is unremarkable. Vocal cords are midline and symmetric. Salivary glands: Edematous changes are present about the submandibular glands bilaterally, right greater than left. There is no duct obstruction or mass lesion. The parotid glands are unremarkable. Thyroid: Normal Lymph nodes: Right greater than left submandibular and level 2 adenopathy is reactive. No necrotic nodes are present. Vascular: Negative. Limited intracranial: Within normal limits. Mastoids and visualized paranasal sinuses: Circumferential mucosal thickening in the inferior maxillary sinuses is worse on the left. Paranasal sinuses are otherwise not imaged. Mastoid air cells are clear. Skeleton: Vertebral body heights alignment are maintained. No focal lytic or blastic lesions are present. Periapical lucencies are present about the medial incisors bilaterally in the mandible. There is a periapical lucency about the right lateral incisor with destruction of the cortex posterior to the apex of tooth 26. A subperiosteal abscess extends laterally and posteriorly from the apex of tooth 26 and the cortical breach. The subperiosteal abscess measures 17 x 18 x 5 mm. Upper chest: The lung apices are clear.  An azygos fissure is noted. Other: Marked edematous changes are present  in the right submandibular space displacing the mylohyoid medially. There is extensive subcutaneous edema and thickening of the platysma. The right sublingual gland is displaced medially. IMPRESSION: 1. Periapical abscesses involving 3 of the mandibular incisors. Please see discussion above. 2. Posterior cortical destruction and associated subperiosteal abscess extending along the medial aspect of the mandible anteriorly with extensive associated inflammatory changes below the floor of the mouth on the right and into the right submandibular space. 3. Inflammatory changes into the right neck of odontogenic etiology. 4. Reactive submandibular and level 2 adenopathy, right greater than left. Electronically Signed   By: CSan MorelleM.D.   On: 08/07/2018 12:25    ROS Blood pressure (!) 104/56, pulse 70, temperature 97.6 F (36.4 C), temperature source Oral, resp. rate 16, SpO2 100 %. General appearance: alert, cooperative and no distress Head: Normocephalic, without obvious abnormality, atraumatic Eyes: negative Nose: Nares normal. Septum midline. Mucosa normal. No drainage or sinus tenderness. Throat: lips, mucosa, and tongue normal; teeth and gums normal and right floor of mouth tenderness, primarily anterior; No trismus-max opening approx 346m Pharynx clear. No obvious caries on exam. Neck: no adenopathy, supple, symmetrical, trachea midline, thyroid not enlarged, symmetric, no tenderness/mass/nodules and mild edema with tenderness right submandibular area  Assessment/Plan: Patient feeling better today since IV abx and steroids. Discussed possible extractions/surgery with patient. Patient doesn't want teeth removed. Will obtain panorex today to review with patient.    ScDiona Browner1/04/2018, 7:13 AM

## 2018-08-08 NOTE — Progress Notes (Deleted)
Received patient from PACU, AOx4, VSS, O2Sat at 100% on RA, ambulated to bathroom to void, had 3 lap sites CDI, oriented to room, bed controls and call light.  Administered PRN Vicodin 2 tabs per order d/t pain at 6/10 after ambulation. Family members came in to see patient.  Will monitor.

## 2018-08-09 ENCOUNTER — Inpatient Hospital Stay (HOSPITAL_COMMUNITY): Payer: Self-pay | Admitting: Certified Registered Nurse Anesthetist

## 2018-08-09 ENCOUNTER — Encounter (HOSPITAL_COMMUNITY)
Admission: EM | Disposition: A | Discharge status: Home / Self Care | Payer: Self-pay | Source: Home / Self Care | Attending: Internal Medicine

## 2018-08-09 HISTORY — PX: INCISION AND DRAINAGE ABSCESS: SHX5864

## 2018-08-09 SURGERY — INCISION AND DRAINAGE, ABSCESS
Anesthesia: General | Site: Mouth

## 2018-08-09 MED ORDER — CHLORHEXIDINE GLUCONATE CLOTH 2 % EX PADS
6.0000 | MEDICATED_PAD | Freq: Every day | CUTANEOUS | Status: DC
  Administered 2018-08-10: 6 via TOPICAL

## 2018-08-09 MED ORDER — PROPOFOL 10 MG/ML IV BOLUS
INTRAVENOUS | Status: AC
  Filled 2018-08-09: qty 20

## 2018-08-09 MED ORDER — FENTANYL CITRATE (PF) 250 MCG/5ML IJ SOLN
INTRAMUSCULAR | Status: AC
  Filled 2018-08-09: qty 5

## 2018-08-09 MED ORDER — LIDOCAINE 2% (20 MG/ML) 5 ML SYRINGE
INTRAMUSCULAR | Status: DC | PRN
  Administered 2018-08-09: 100 mg via INTRAVENOUS

## 2018-08-09 MED ORDER — ONDANSETRON HCL 4 MG/2ML IJ SOLN: 4.0000 mg | Freq: Once | INTRAMUSCULAR | Status: DC | PRN

## 2018-08-09 MED ORDER — SUCCINYLCHOLINE CHLORIDE 200 MG/10ML IV SOSY
PREFILLED_SYRINGE | INTRAVENOUS | Status: DC | PRN
  Administered 2018-08-09: 100 mg via INTRAVENOUS

## 2018-08-09 MED ORDER — 0.9 % SODIUM CHLORIDE (POUR BTL) OPTIME
TOPICAL | Status: DC | PRN
  Administered 2018-08-09: 1000 mL

## 2018-08-09 MED ORDER — OXYCODONE HCL 5 MG/5ML PO SOLN: 5.0000 mg | Freq: Once | ORAL | Status: DC | PRN

## 2018-08-09 MED ORDER — DEXAMETHASONE SODIUM PHOSPHATE 10 MG/ML IJ SOLN
INTRAMUSCULAR | Status: AC
  Filled 2018-08-09: qty 1

## 2018-08-09 MED ORDER — MIDAZOLAM HCL 2 MG/2ML IJ SOLN
INTRAMUSCULAR | Status: AC
  Filled 2018-08-09: qty 2

## 2018-08-09 MED ORDER — OXYCODONE HCL 5 MG PO TABS: 5.0000 mg | ORAL_TABLET | Freq: Once | ORAL | Status: DC | PRN

## 2018-08-09 MED ORDER — PROPOFOL 10 MG/ML IV BOLUS
INTRAVENOUS | Status: DC | PRN
  Administered 2018-08-09: 200 mg via INTRAVENOUS

## 2018-08-09 MED ORDER — FENTANYL CITRATE (PF) 100 MCG/2ML IJ SOLN: 25.0000 ug | INTRAMUSCULAR | Status: DC | PRN

## 2018-08-09 MED ORDER — LACTATED RINGERS IV SOLN
INTRAVENOUS | Status: DC
  Administered 2018-08-09: 08:00:00 via INTRAVENOUS

## 2018-08-09 MED ORDER — LIDOCAINE-EPINEPHRINE 1 %-1:100000 IJ SOLN
INTRAMUSCULAR | Status: DC | PRN
  Administered 2018-08-09: 8 mL

## 2018-08-09 MED ORDER — MIDAZOLAM HCL 2 MG/2ML IJ SOLN
INTRAMUSCULAR | Status: DC | PRN
  Administered 2018-08-09: 2 mg via INTRAVENOUS

## 2018-08-09 MED ORDER — CEFAZOLIN SODIUM-DEXTROSE 2-3 GM-%(50ML) IV SOLR
INTRAVENOUS | Status: DC | PRN
  Administered 2018-08-09: 2 g via INTRAVENOUS

## 2018-08-09 MED ORDER — MUPIROCIN 2 % EX OINT
1.0000 "application " | TOPICAL_OINTMENT | Freq: Two times a day (BID) | CUTANEOUS | Status: DC
  Administered 2018-08-09 – 2018-08-10 (×3): 1 via NASAL
  Filled 2018-08-09: qty 22

## 2018-08-09 MED ORDER — FENTANYL CITRATE (PF) 250 MCG/5ML IJ SOLN
INTRAMUSCULAR | Status: DC | PRN
  Administered 2018-08-09: 100 ug via INTRAVENOUS

## 2018-08-09 MED ORDER — HYDROCODONE-ACETAMINOPHEN 5-325 MG PO TABS: 1.0000 | ORAL_TABLET | ORAL | Status: DC | PRN

## 2018-08-09 MED ORDER — ONDANSETRON HCL 4 MG/2ML IJ SOLN
INTRAMUSCULAR | Status: AC
  Filled 2018-08-09: qty 2

## 2018-08-09 SURGICAL SUPPLY — 60 items
BLADE 10 SAFETY STRL DISP (BLADE) ×4 IMPLANT
BLADE SURG 15 STRL LF DISP TIS (BLADE) ×2 IMPLANT
BLADE SURG 15 STRL SS (BLADE) ×2
BNDG CONFORM 2 STRL LF (GAUZE/BANDAGES/DRESSINGS) ×4 IMPLANT
BUR CROSS CUT FISSURE 1.6 (BURR) ×1 IMPLANT
BUR CROSS CUT FISSURE 1.6MM (BURR)
BUR EGG ELITE 4.0 (BURR) ×1 IMPLANT
BUR EGG ELITE 4.0MM (BURR)
CANISTER SUCT 3000ML PPV (MISCELLANEOUS) ×4 IMPLANT
COVER SURGICAL LIGHT HANDLE (MISCELLANEOUS) ×8 IMPLANT
COVER WAND RF STERILE (DRAPES) ×1 IMPLANT
CRADLE DONUT ADULT HEAD (MISCELLANEOUS) IMPLANT
DECANTER SPIKE VIAL GLASS SM (MISCELLANEOUS) ×4 IMPLANT
DRAIN PENROSE 1/4X12 LTX STRL (WOUND CARE) ×4 IMPLANT
DRAPE U-SHAPE 76X120 STRL (DRAPES) ×1 IMPLANT
DRSG PAD ABDOMINAL 8X10 ST (GAUZE/BANDAGES/DRESSINGS) IMPLANT
ELECT COATED BLADE 2.86 ST (ELECTRODE) IMPLANT
ELECT REM PT RETURN 9FT ADLT (ELECTROSURGICAL) ×4
ELECTRODE REM PT RTRN 9FT ADLT (ELECTROSURGICAL) ×2 IMPLANT
GAUZE 4X4 16PLY RFD (DISPOSABLE) ×4 IMPLANT
GAUZE PACKING FOLDED 2  STR (GAUZE/BANDAGES/DRESSINGS) ×2
GAUZE PACKING FOLDED 2 STR (GAUZE/BANDAGES/DRESSINGS) ×2 IMPLANT
GAUZE SPONGE 4X4 12PLY STRL (GAUZE/BANDAGES/DRESSINGS) IMPLANT
GLOVE BIO SURGEON STRL SZ 6.5 (GLOVE) ×2 IMPLANT
GLOVE BIO SURGEON STRL SZ7 (GLOVE) ×3 IMPLANT
GLOVE BIO SURGEON STRL SZ7.5 (GLOVE) ×4 IMPLANT
GLOVE BIO SURGEONS STRL SZ 6.5 (GLOVE) ×1
GLOVE BIOGEL PI IND STRL 6.5 (GLOVE) IMPLANT
GLOVE BIOGEL PI IND STRL 7.0 (GLOVE) IMPLANT
GLOVE BIOGEL PI INDICATOR 6.5 (GLOVE)
GLOVE BIOGEL PI INDICATOR 7.0 (GLOVE)
GOWN STRL REUS W/ TWL LRG LVL3 (GOWN DISPOSABLE) ×2 IMPLANT
GOWN STRL REUS W/ TWL XL LVL3 (GOWN DISPOSABLE) ×2 IMPLANT
GOWN STRL REUS W/TWL LRG LVL3 (GOWN DISPOSABLE) ×2
GOWN STRL REUS W/TWL XL LVL3 (GOWN DISPOSABLE) ×2
IV NS 1000ML (IV SOLUTION)
IV NS 1000ML BAXH (IV SOLUTION) ×1 IMPLANT
KIT BASIN OR (CUSTOM PROCEDURE TRAY) ×4 IMPLANT
KIT TURNOVER KIT B (KITS) ×4 IMPLANT
MARKER SKIN DUAL TIP RULER LAB (MISCELLANEOUS) ×1 IMPLANT
NDL HYPO 25GX1X1/2 BEV (NEEDLE) ×1 IMPLANT
NEEDLE 22X1 1/2 (OR ONLY) (NEEDLE) ×8 IMPLANT
NEEDLE HYPO 25GX1X1/2 BEV (NEEDLE) ×4 IMPLANT
NS IRRIG 1000ML POUR BTL (IV SOLUTION) ×4 IMPLANT
PACK SURGICAL SETUP 50X90 (CUSTOM PROCEDURE TRAY) ×1 IMPLANT
PAD ARMBOARD 7.5X6 YLW CONV (MISCELLANEOUS) ×8 IMPLANT
PENCIL BUTTON HOLSTER BLD 10FT (ELECTRODE) IMPLANT
SPONGE SURGIFOAM ABS GEL 12-7 (HEMOSTASIS) IMPLANT
SUT CHROMIC 3 0 PS 2 (SUTURE) ×4 IMPLANT
SUT ETHILON 2 0 FS 18 (SUTURE) ×4 IMPLANT
SUT SILK 3 0 SH 30 (SUTURE) ×3 IMPLANT
SWAB COLLECTION DEVICE MRSA (MISCELLANEOUS) ×1 IMPLANT
SWAB CULTURE ESWAB REG 1ML (MISCELLANEOUS) ×1 IMPLANT
SYR BULB IRRIGATION 50ML (SYRINGE) ×4 IMPLANT
SYR CONTROL 10ML LL (SYRINGE) ×4 IMPLANT
TRAY ENT MC OR (CUSTOM PROCEDURE TRAY) ×4 IMPLANT
TUBE CONNECTING 12'X1/4 (SUCTIONS)
TUBE CONNECTING 12X1/4 (SUCTIONS) ×1 IMPLANT
TUBING IRRIGATION (MISCELLANEOUS) ×1 IMPLANT
YANKAUER SUCT BULB TIP NO VENT (SUCTIONS) ×4 IMPLANT

## 2018-08-09 NOTE — Anesthesia Procedure Notes (Signed)
Procedure Name: Intubation Date/Time: 08/09/2018 9:27 AM Performed by: Modena Morrow, CRNA Pre-anesthesia Checklist: Patient identified, Emergency Drugs available, Suction available, Timeout performed and Patient being monitored Patient Re-evaluated:Patient Re-evaluated prior to induction Oxygen Delivery Method: Circle system utilized Preoxygenation: Pre-oxygenation with 100% oxygen Induction Type: IV induction Ventilation: Mask ventilation without difficulty Laryngoscope Size: Miller and 2 Grade View: Grade II Nasal Tubes: Right and Nasal prep performed Tube size: 7.5 mm Number of attempts: 1 Placement Confirmation: ETT inserted through vocal cords under direct vision,  positive ETCO2 and breath sounds checked- equal and bilateral Secured at: 24 cm Tube secured with: Tape Dental Injury: Teeth and Oropharynx as per pre-operative assessment

## 2018-08-09 NOTE — Op Note (Signed)
08/09/2018  9:40 AM  PATIENT:  Robert Kent  32 y.o. male  PRE-OPERATIVE DIAGNOSIS: Right sublingual space abscess, pulpitis with abscess teeth # 24, 25, 26  POST-OPERATIVE DIAGNOSIS:  SAME  PROCEDURE:  Procedure(s): INCISION AND DRAINAGE ABSCESS SUBLINGUAL  SURGEON:  Surgeon(s): [name], DDS  ANESTHESIA:   local and general  EBL:  minimal  DRAINS: none   SPECIMEN:  No Specimen  COUNTS:  YES  PLAN OF CARE: PATIENT DISPOSITION:  PACU - hemodynamically stable.   PROCEDURE DETAILS: Dictation # [id number]  Georgia Lopes, DMD 08/09/2018 9:40 AM

## 2018-08-09 NOTE — Transfer of Care (Signed)
Immediate Anesthesia Transfer of Care Note  Patient: Robert Kent  Procedure(s) Performed: INCISION AND DRAINAGE ABSCESS (N/A Mouth)  Patient Location: PACU  Anesthesia Type:General  Level of Consciousness: drowsy and patient cooperative  Airway & Oxygen Therapy: Patient Spontanous Breathing and Patient connected to face mask oxygen  Post-op Assessment: Report given to RN, Post -op Vital signs reviewed and stable and Patient moving all extremities  Post vital signs: Reviewed and stable  Last Vitals:  Vitals Value Taken Time  BP    Temp    Pulse 96 08/09/2018  9:55 AM  Resp 15 08/09/2018  9:55 AM  SpO2 100 % 08/09/2018  9:55 AM  Vitals shown include unvalidated device data.  Last Pain:  Vitals:   08/09/18 0541  TempSrc: Oral  PainSc:       Patients Stated Pain Goal: 3 (08/08/18 0405)  Complications: No apparent anesthesia complications

## 2018-08-09 NOTE — Plan of Care (Signed)
  Problem: Education: Goal: Knowledge of General Education information will improve Description: Including pain rating scale, medication(s)/side effects and non-pharmacologic comfort measures Outcome: Progressing   Problem: Clinical Measurements: Goal: Ability to maintain clinical measurements within normal limits will improve Outcome: Progressing Goal: Will remain free from infection Outcome: Progressing   

## 2018-08-09 NOTE — Anesthesia Preprocedure Evaluation (Addendum)
Anesthesia Evaluation  Patient identified by MRN, date of birth, ID band Patient awake    Reviewed: Allergy & Precautions, NPO status , Patient's Chart, lab work & pertinent test results  History of Anesthesia Complications Negative for: history of anesthetic complications  Airway Mallampati: II  TM Distance: >3 FB Neck ROM: Full    Dental no notable dental hx.    Pulmonary neg pulmonary ROS, Current Smoker,    Pulmonary exam normal        Cardiovascular negative cardio ROS Normal cardiovascular exam     Neuro/Psych negative neurological ROS  negative psych ROS   GI/Hepatic negative GI ROS, Neg liver ROS,   Endo/Other  negative endocrine ROS  Renal/GU negative Renal ROS  negative genitourinary   Musculoskeletal negative musculoskeletal ROS (+)   Abdominal   Peds  Hematology negative hematology ROS (+)   Anesthesia Other Findings   Reproductive/Obstetrics                            Anesthesia Physical Anesthesia Plan  ASA: I  Anesthesia Plan: General   Post-op Pain Management:    Induction: Intravenous  PONV Risk Score and Plan: 1  Airway Management Planned: Nasal ETT  Additional Equipment: None  Intra-op Plan:   Post-operative Plan: Extubation in OR  Informed Consent: I have reviewed the patients History and Physical, chart, labs and discussed the procedure including the risks, benefits and alternatives for the proposed anesthesia with the patient or authorized representative who has indicated his/her understanding and acceptance.     Plan Discussed with:   Anesthesia Plan Comments:        Anesthesia Quick Evaluation

## 2018-08-09 NOTE — Anesthesia Postprocedure Evaluation (Signed)
Anesthesia Post Note  Patient: Robert Kent  Procedure(s) Performed: INCISION AND DRAINAGE ABSCESS (N/A Mouth)     Patient location during evaluation: PACU Anesthesia Type: General Level of consciousness: awake and alert Pain management: pain level controlled Vital Signs Assessment: post-procedure vital signs reviewed and stable Respiratory status: spontaneous breathing, nonlabored ventilation and respiratory function stable Cardiovascular status: blood pressure returned to baseline and stable Postop Assessment: no apparent nausea or vomiting Anesthetic complications: no    Last Vitals:  Vitals:   08/09/18 1049 08/09/18 1111  BP: 124/86 117/75  Pulse: 73 (!) 57  Resp: 16 16  Temp: 37.1 C 36.9 C  SpO2: 100% 100%    Last Pain:  Vitals:   08/09/18 1111  TempSrc: Oral  PainSc:                  Lucretia Kern

## 2018-08-09 NOTE — Discharge Instructions (Signed)
Warm salt water mouth rinses twice daily.  Ice to face/jaws as needed. Soft diet. Follow up with Dr. Barbette Merino Monday, November 11. Call [phone] for appointment time for drain removal.

## 2018-08-09 NOTE — H&P (Signed)
H&P documentation  -History and Physical Reviewed  -Patient has been re-examined  -No change in the plan of care  Robert Kent  

## 2018-08-09 NOTE — Progress Notes (Signed)
PROGRESS NOTE    Robert Kent  ZHY:[id number] DOB: [birth year] DOA: 08/07/2018 PCP: Patient, No Pcp Per    Brief Narrative:  32 y.o. male withno significant pastmedical historypresenting with jaw pain.He noticed right >left jaw pain yesterday. He was perfectly fine prior. No dental pain or swelling prior to yesterday. He is currently unable to eat or drink due to the swelling and trismus. No fever. He has never had this issue before. Last saw a dentist >5-6 years ago.  Assessment & Plan:   Principal Problem:   Acute periapical abscess Active Problems:   Dental abscess  Acute periapical abscess -Otherwise healthy refugee presenting with acute onset of R>L mandibular swelling and pain -Imaging indicates periapical abscesses with cortical destruction and extensive inflammatory changes, likely associated with poor dentition -Dr. Barbette Merino from oral surgery consulted -Patient underwent I/D in OR 11/8 -For now, continue IV Clindamycin 600 mg q6h and 10 mg IV Decadron BID -Dr. Doran Heater (ENT) was consulted by telephone by the EDP but there does not appear to be a current role for ENT given his intact airway -seems stable at present. Continues with drain in place  DVT prophylaxis: Ambulation Code Status: Full Family Communication: Pt in room, family at bedside Disposition Plan: Uncertain at this time  Consultants:   Oral Surgery  Procedures:   I/D of dental abscess 11/8  Antimicrobials: Anti-infectives (From admission, onward)   Start     Dose/Rate Route Frequency Ordered Stop   08/09/18 0900  ceFAZolin (ANCEF) IVPB 2g/100 mL premix  Status:  Discontinued     2 g 200 mL/hr over 30 Minutes Intravenous To [facility] 08/08/18 1821 08/09/18 1033   08/07/18 1800  clindamycin (CLEOCIN) IVPB 600 mg     600 mg 100 mL/hr over 30 Minutes Intravenous Every 6 hours 08/07/18 1550     08/07/18 1300  clindamycin (CLEOCIN) IVPB 600 mg     600 mg 100 mL/hr over 30  Minutes Intravenous  Once 08/07/18 1249 08/07/18 1558       Subjective: Eager to go home  Objective: Vitals:   08/09/18 1025 08/09/18 1049 08/09/18 1111 08/09/18 1554  BP: 127/83 124/86 117/75 (!) 91/47  Pulse: 87 73 (!) 57 (!) 58  Resp: 15 16 16 16   Temp: 97.7 F (36.5 C) 98.8 F (37.1 C) 98.4 F (36.9 C) 97.9 F (36.6 C)  TempSrc:  Axillary Oral Oral  SpO2: 100% 100% 100% 99%  Weight:      Height:        Intake/Output Summary (Last 24 hours) at 08/09/2018 1708 Last data filed at 08/09/2018 1223 Gross per 24 hour  Intake 2294.88 ml  Output 760 ml  Net 1534.88 ml   Filed Weights   08/08/18 1710  Weight: 61.1 kg    Examination:  General exam: Appears calm and comfortable  Respiratory system: Clear to auscultation. Respiratory effort normal. Cardiovascular system: S1 & S2 heard, RRR Gastrointestinal system: Abdomen is nondistended, soft and nontender. No organomegaly or masses felt. Normal bowel sounds heard. Central nervous system: Alert and oriented. No focal neurological deficits. Extremities: Symmetric 5 x 5 power. Skin: No rashes, lesions  Psychiatry: Judgement and insight appear normal. Mood & affect appropriate.   Data Reviewed: I have personally reviewed following labs and imaging studies  CBC: Recent Labs  Lab 08/07/18 1041 08/07/18 1055 08/08/18 0336  WBC 11.9*  --  11.9*  NEUTROABS 8.8*  --   --   HGB 13.7 16.0 12.0*  HCT 45.2 47.0  38.3*  MCV 81.9  --  80.8  PLT 108*  --  99*   Basic Metabolic Panel: Recent Labs  Lab 08/07/18 1041 08/07/18 1055 08/08/18 0336  NA 136 137 140  K 3.8 3.8 4.0  CL 100 98 107  CO2 25  --  24  GLUCOSE 113* 113* 137*  BUN 10 11 13   CREATININE 0.96 0.90 0.73  CALCIUM 9.6  --  9.2   GFR: Estimated Creatinine Clearance: 116.7 mL/min (by C-G formula based on SCr of 0.73 mg/dL). Liver Function Tests: Recent Labs  Lab 08/07/18 1041  AST 18  ALT 17  ALKPHOS 45  BILITOT 1.5*  PROT 7.9  ALBUMIN 4.6   No  results for input(s): LIPASE, AMYLASE in the last 168 hours. No results for input(s): AMMONIA in the last 168 hours. Coagulation Profile: No results for input(s): INR, PROTIME in the last 168 hours. Cardiac Enzymes: No results for input(s): CKTOTAL, CKMB, CKMBINDEX, TROPONINI in the last 168 hours. BNP (last 3 results) No results for input(s): PROBNP in the last 8760 hours. HbA1C: No results for input(s): HGBA1C in the last 72 hours. CBG: No results for input(s): GLUCAP in the last 168 hours. Lipid Profile: No results for input(s): CHOL, HDL, LDLCALC, TRIG, CHOLHDL, LDLDIRECT in the last 72 hours. Thyroid Function Tests: No results for input(s): TSH, T4TOTAL, FREET4, T3FREE, THYROIDAB in the last 72 hours. Anemia Panel: No results for input(s): VITAMINB12, FOLATE, FERRITIN, TIBC, IRON, RETICCTPCT in the last 72 hours. Sepsis Labs: Recent Labs  Lab 08/07/18 1055  LATICACIDVEN 1.02    Recent Results (from the past 240 hour(s))  Group A Strep by PCR     Status: None   Collection Time: 08/07/18 12:10 PM  Result Value Ref Range Status   Group A Strep by PCR NOT DETECTED NOT DETECTED Final    Comment: Performed at [facility], [address] [place], [place] [address]  Surgical pcr screen     Status: Abnormal   Collection Time: 08/08/18  7:36 PM  Result Value Ref Range Status   MRSA, PCR NEGATIVE NEGATIVE Final   Staphylococcus aureus POSITIVE (A) NEGATIVE Final    Comment: (NOTE) The Xpert SA Assay (FDA approved for NASAL specimens in patients 22 years of age and older), is one component of a comprehensive surveillance program. It is not intended to diagnose infection nor to guide or monitor treatment. Performed at [facility], [address], [place], [place] [address]      Radiology Studies: Dg Orthopantogram  Result Date: 08/08/2018 CLINICAL DATA:  Right lower jaw pain.  Dental abscess. EXAM: ORTHOPANTOGRAM/PANORAMIC COMPARISON:  CT scan of August 07, 2018. FINDINGS: There is noted lucency involving at least 2 incisors in the mandible anteriorly. This corresponds to findings on CT scan suggesting periapical abscesses. No fracture or other abnormality is noted within the visualized mandible. IMPRESSION: Lucency involving the roots of at least 2 incisors anteriorly in the mandible corresponding to findings of prior CT scan. Electronically Signed   By: Lupita Raider, M.D.   On: 08/08/2018 09:56    Scheduled Meds: . Chlorhexidine Gluconate Cloth  6 each Topical Daily  . dexamethasone  10 mg Intravenous Q12H  . mupirocin ointment  1 application Nasal BID   Continuous Infusions: . clindamycin (CLEOCIN) IV 600 mg (08/09/18 1226)  . lactated ringers Stopped (08/09/18 0981)  . lactated ringers 10 mL/hr at 08/09/18 0755     LOS: 1 day  Rickey Barbara, MD [facility] Pager On Amion  If 7PM-7AM, please contact night-coverage 08/09/2018, 5:08 PM

## 2018-08-09 NOTE — Op Note (Signed)
NAMESADAT, SLIWA MEDICAL RECORD ZO:[id number] ACCOUNT [id number] DATE OF BIRTH:1986/06/27 FACILITY: [facility] LOCATION: [facility] PHYSICIAN:Charmayne Odell M. Charlese Gruetzmacher, DDS  OPERATIVE REPORT  DATE OF PROCEDURE:  08/09/2018  PREOPERATIVE DIAGNOSIS:  Right sublingual space abscess infection, pulpitis with abscessed teeth numbers 24, 25, 26.  POSTOPERATIVE DIAGNOSIS:  Right sublingual space abscess infection, pulpitis with abscessed teeth numbers 24, 25, 26.  PROCEDURE:  Incision and drainage sublingual abscess.  SURGEON:  [name], DDS  ANESTHESIA:  General, Dr. Darlin Drop attending.    INDICATIONS:  The patient is a 32 year old male who began developing swelling on the right general and  floor of mouth approximately 3 days ago.  He presented to [facility] ER and was admitted and placed on IV antibiotics.    CT and Panorex radiographs were obtained which demonstrated periapical abscesses at the apices of teeth #24, 25 and 26 which had presumably been the cause of sublingual swelling.  This was discussed with the patient and it was recommended that the teeth  be removed and incision and drainage be performed.  The patient, however, refused to have removal of the teeth and preferred to see a dentist after discharge from hospital for root canal therapy.  The patient did, however, agree to have the incision and  drainage performed.  DESCRIPTION OF PROCEDURE:  The patient was taken to the operating room and placed on the table in supine position.  General anesthesia was administered intravenously and a nasal endotracheal tube was placed and secured.  The eyes were protected and the  patient was draped for surgery.  A timeout was performed.  The posterior pharynx was suctioned and a throat pack was placed, 2% lidocaine 1:100,000 epinephrine was infiltrated in a right inferior alveolar block and around the anterior lingual border of  the mandible and the soft tissues.  There was mild to moderate edema, no frank  fluctuance.  A 15 blade was used to make an incision at the point of most dependent edema and clear fluid mixed with local anesthesia came out.  The area was probed with the  dental curette, taking care to remain close to the bony lingual border of the mandible.  A hemostat was used to gently explore the area.  No loculations of purulence were found.  A quarter inch Penrose drain was then placed into the incision area and  sutured with a 3-0 silk.  Then, the oral cavity was irrigated and suctioned and a throat pack was removed.    The patient was left in care of Anesthesia for extubation and transport to recovery room for discharge to the floor.  ESTIMATED BLOOD LOSS:  Minimal.  COMPLICATIONS:  None.  SPECIMENS:  None.  AN/NUANCE  D:08/09/2018 T:08/09/2018 JOB:[id number]

## 2018-08-10 ENCOUNTER — Encounter (HOSPITAL_COMMUNITY): Payer: Self-pay | Admitting: Oral Surgery

## 2018-08-10 DIAGNOSIS — K047 Periapical abscess without sinus: Principal | ICD-10-CM

## 2018-08-10 DIAGNOSIS — M272 Inflammatory conditions of jaws: Secondary | ICD-10-CM

## 2018-08-10 LAB — CBC
HCT: 38.1 % — ABNORMAL LOW (ref 39.0–52.0)
HEMOGLOBIN: 11.8 g/dL — AB (ref 13.0–17.0)
MCH: 25.1 pg — ABNORMAL LOW (ref 26.0–34.0)
MCHC: 31 g/dL (ref 30.0–36.0)
MCV: 81.1 fL (ref 80.0–100.0)
NRBC: 0 % (ref 0.0–0.2)
Platelets: 132 10*3/uL — ABNORMAL LOW (ref 150–400)
RBC: 4.7 MIL/uL (ref 4.22–5.81)
RDW: 13.2 % (ref 11.5–15.5)
WBC: 10 10*3/uL (ref 4.0–10.5)

## 2018-08-10 MED ORDER — CLINDAMYCIN HCL 300 MG PO CAPS: 300.0000 mg | ORAL_CAPSULE | Freq: Three times a day (TID) | ORAL | 0 refills | Status: AC

## 2018-08-10 MED ORDER — HYDROCODONE-ACETAMINOPHEN 5-325 MG PO TABS: 1.0000 | ORAL_TABLET | ORAL | 0 refills | Status: DC | PRN

## 2018-08-10 NOTE — Progress Notes (Signed)
Yandiel Townsend Roger to be D/C'd  per MD order. Discussed with the patient and all questions fully answered.  VSS, Skin clean, dry and intact without evidence of skin break down, no evidence of skin tears noted.  IV catheter discontinued intact. Site without signs and symptoms of complications. Dressing and pressure applied.  An After Visit Summary was printed and given to the patient. Patient received prescription.  D/c education completed with patient/family including follow up instructions, medication list, d/c activities limitations if indicated, with other d/c instructions as indicated by MD - patient able to verbalize understanding, all questions fully answered.   Patient instructed to return to ED, call 911, or call MD for any changes in condition.   Patient to be escorted via WC, and D/C home via private auto.

## 2018-08-10 NOTE — Progress Notes (Signed)
Robert Kent PROGRESS NOTE:   SUBJECTIVE: Feeling better. Eating well.  OBJECTIVE:  Vitals: Blood pressure (!) 111/56, pulse 61, temperature 97.9 F (36.6 C), temperature source Oral, resp. rate 18, height 5\' 10"  (1.778 m), weight 61.1 kg, SpO2 100 %. Lab results: Results for orders placed or performed during the hospital encounter of 08/07/18 (from the past 24 hour(s))  CBC     Status: Abnormal   Collection Time: 08/10/18  4:14 AM  Result Value Ref Range   WBC 10.0 4.0 - 10.5 K/uL   RBC 4.70 4.22 - 5.81 MIL/uL   Hemoglobin 11.8 (L) 13.0 - 17.0 g/dL   HCT 27.2 (L) 53.6 - 64.4 %   MCV 81.1 80.0 - 100.0 fL   MCH 25.1 (L) 26.0 - 34.0 pg   MCHC 31.0 30.0 - 36.0 g/dL   RDW 03.4 74.2 - 59.5 %   Platelets 132 (L) 150 - 400 K/uL   nRBC 0.0 0.0 - 0.2 %   Radiology Results: No results found. General appearance: alert, cooperative and no distress Head: Normocephalic, without obvious abnormality, atraumatic Eyes: negative Nose: Nares normal. Septum midline. Mucosa normal. No drainage or sinus tenderness. Throat: mild right floor of mouth edema. Drain present. No trismus. Pharynx clear. Neck: no adenopathy, supple, symmetrical, trachea midline, thyroid not enlarged, symmetric, no tenderness/mass/nodules and minimal edema right submandibular  ASSESSMENT: Much improved s/p IV antibiotics, I and D.  PLAN: D/C home per hospitalist. PO antibiotics. Will see in office Monday Nov 11 for drain removal.   [name] 08/10/2018

## 2018-08-10 NOTE — Discharge Summary (Addendum)
Physician Discharge Summary  Robert Kent ZOX:[id number] DOB: 1986/06/23 DOA: 08/07/2018  PCP: Patient, No Pcp Per  Admit date: 08/07/2018 Discharge date: 08/10/2018  Admitted From: Home Disposition:  Home  Recommendations for Outpatient Follow-up:  1. Follow up with PCP in 1-2 weeks 2. Follow up with Dentist as scheduled  NCCSR reviewed. No controlled substances listed  Discharge Condition:Improved CODE STATUS:Full Diet recommendation: Regular as tolerated   Brief/Interim Summary: 32 y.o.malewithno significant pastmedical historypresenting with jaw pain.He noticed right >left jaw pain yesterday. He was perfectly fine prior. No dental pain or swelling prior to yesterday. He is currently unable to eat or drink due to the swelling and trismus. No fever. He has never had this issue before. Last saw a dentist >5-6 years ago.  Discharge Diagnoses:  Principal Problem:   Acute periapical abscess Active Problems:   Dental abscess  Acute periapical abscess -Otherwise healthy refugee presenting with acute onset of R>L mandibular swelling and pain -Imaging indicates periapical abscesses with cortical destruction and extensive inflammatory changes, likely associated with poor dentition -Dr. Barbette Merino from oral surgeryconsulted -Patient underwent I/D in OR 11/8 -For now, continue IV Clindamycin 600 mg q6h and 10 mg IV Decadron BID -Dr. Doran Heater (ENT) was consulted by telephone by the EDP but there does not appear to be a current role for ENT given his intact airway -remained stable. Dr. Barbette Merino recommendation for OK for discharge with PO abx on discharge  Discharge Instructions   Allergies as of 08/10/2018   No Known Allergies     Medication List    TAKE these medications   acetaminophen 500 MG tablet Commonly known as:  TYLENOL Take 1,000 mg by mouth every 6 (six) hours as needed for mild pain.   clindamycin 300 MG capsule Commonly known as:  CLEOCIN Take 1  capsule (300 mg total) by mouth 3 (three) times daily for 7 days.   HYDROcodone-acetaminophen 5-325 MG tablet Commonly known as:  NORCO/VICODIN Take 1-2 tablets by mouth every 4 (four) hours as needed for moderate pain.      Follow-up Information    Follow up with PCP in 1-2 weeks Follow up.        Follow up with dentist as scheduled Follow up.          No Known Allergies  Consultations:  Oral Surgeon  Procedures/Studies: Dg Orthopantogram  Result Date: 08/08/2018 CLINICAL DATA:  Right lower jaw pain.  Dental abscess. EXAM: ORTHOPANTOGRAM/PANORAMIC COMPARISON:  CT scan of August 07, 2018. FINDINGS: There is noted lucency involving at least 2 incisors in the mandible anteriorly. This corresponds to findings on CT scan suggesting periapical abscesses. No fracture or other abnormality is noted within the visualized mandible. IMPRESSION: Lucency involving the roots of at least 2 incisors anteriorly in the mandible corresponding to findings of prior CT scan. Electronically Signed   By: Lupita Raider, M.D.   On: 08/08/2018 09:56   Ct Soft Tissue Neck W Contrast  Result Date: 08/07/2018 CLINICAL DATA:  Pain and swelling in the right side of the neck. Symptoms began yesterday. EXAM: CT NECK WITH CONTRAST TECHNIQUE: Multidetector CT imaging of the neck was performed using the standard protocol following the bolus administration of intravenous contrast. CONTRAST:  OMNIPAQUE IOHEXOL 300 MG/ML  SOLN COMPARISON:  None. FINDINGS: Pharynx and larynx: No focal mucosal or submucosal lesions are present. Nasopharynx is within normal limits. Soft palate and tongue base are normal. Palatine tonsils are within normal limits. Epiglottis is normal. Hypopharynx is unremarkable.  Vocal cords are midline and symmetric. Salivary glands: Edematous changes are present about the submandibular glands bilaterally, right greater than left. There is no duct obstruction or mass lesion. The parotid glands are  unremarkable. Thyroid: Normal Lymph nodes: Right greater than left submandibular and level 2 adenopathy is reactive. No necrotic nodes are present. Vascular: Negative. Limited intracranial: Within normal limits. Mastoids and visualized paranasal sinuses: Circumferential mucosal thickening in the inferior maxillary sinuses is worse on the left. Paranasal sinuses are otherwise not imaged. Mastoid air cells are clear. Skeleton: Vertebral body heights alignment are maintained. No focal lytic or blastic lesions are present. Periapical lucencies are present about the medial incisors bilaterally in the mandible. There is a periapical lucency about the right lateral incisor with destruction of the cortex posterior to the apex of tooth 26. A subperiosteal abscess extends laterally and posteriorly from the apex of tooth 26 and the cortical breach. The subperiosteal abscess measures 17 x 18 x 5 mm. Upper chest: The lung apices are clear.  An azygos fissure is noted. Other: Marked edematous changes are present in the right submandibular space displacing the mylohyoid medially. There is extensive subcutaneous edema and thickening of the platysma. The right sublingual gland is displaced medially. IMPRESSION: 1. Periapical abscesses involving 3 of the mandibular incisors. Please see discussion above. 2. Posterior cortical destruction and associated subperiosteal abscess extending along the medial aspect of the mandible anteriorly with extensive associated inflammatory changes below the floor of the mouth on the right and into the right submandibular space. 3. Inflammatory changes into the right neck of odontogenic etiology. 4. Reactive submandibular and level 2 adenopathy, right greater than left. Electronically Signed   By: Marin Roberts M.D.   On: 08/07/2018 12:25     Subjective: Eager to go home  Discharge Exam: Vitals:   08/10/18 0221 08/10/18 0519  BP: (!) 112/54 (!) 111/56  Pulse: (!) 58 61  Resp: 18 18   Temp: 97.8 F (36.6 C) 97.9 F (36.6 C)  SpO2: 100% 100%   Vitals:   08/09/18 1901 08/09/18 2124 08/10/18 0221 08/10/18 0519  BP: 107/65 (!) 109/58 (!) 112/54 (!) 111/56  Pulse: 60 (!) 55 (!) 58 61  Resp: 18 18 18 18   Temp: 98.7 F (37.1 C) 97.6 F (36.4 C) 97.8 F (36.6 C) 97.9 F (36.6 C)  TempSrc: Oral Oral Oral Oral  SpO2: 100% 100% 100% 100%  Weight:      Height:        General: Pt is alert, awake, not in acute distress Cardiovascular: RRR, S1/S2 +, no rubs, no gallops Respiratory: CTA bilaterally, no wheezing, no rhonchi Abdominal: Soft, NT, ND, bowel sounds + Extremities: no edema, no cyanosis   The results of significant diagnostics from this hospitalization (including imaging, microbiology, ancillary and laboratory) are listed below for reference.     Microbiology: Recent Results (from the past 240 hour(s))  Group A Strep by PCR     Status: None   Collection Time: 08/07/18 12:10 PM  Result Value Ref Range Status   Group A Strep by PCR NOT DETECTED NOT DETECTED Final    Comment: Performed at [facility], [address] [place], [place] [address]  Surgical pcr screen     Status: Abnormal   Collection Time: 08/08/18  7:36 PM  Result Value Ref Range Status   MRSA, PCR NEGATIVE NEGATIVE Final   Staphylococcus aureus POSITIVE (A) NEGATIVE Final    Comment: (NOTE) The Xpert SA Assay (FDA  approved for NASAL specimens in patients 55 years of age and older), is one component of a comprehensive surveillance program. It is not intended to diagnose infection nor to guide or monitor treatment. Performed at [facility], [address] [place], [place] [address]      Labs: BNP (last 3 results) No results for input(s): BNP in the last 8760 hours. Basic Metabolic Panel: Recent Labs  Lab 08/07/18 1041 08/07/18 1055 08/08/18 0336  NA 136 137 140  K 3.8 3.8 4.0  CL 100 98 107  CO2 25  --  24  GLUCOSE 113* 113* 137*  BUN 10 11 13   CREATININE  0.96 0.90 0.73  CALCIUM 9.6  --  9.2   Liver Function Tests: Recent Labs  Lab 08/07/18 1041  AST 18  ALT 17  ALKPHOS 45  BILITOT 1.5*  PROT 7.9  ALBUMIN 4.6   No results for input(s): LIPASE, AMYLASE in the last 168 hours. No results for input(s): AMMONIA in the last 168 hours. CBC: Recent Labs  Lab 08/07/18 1041 08/07/18 1055 08/08/18 0336 08/10/18 0414  WBC 11.9*  --  11.9* 10.0  NEUTROABS 8.8*  --   --   --   HGB 13.7 16.0 12.0* 11.8*  HCT 45.2 47.0 38.3* 38.1*  MCV 81.9  --  80.8 81.1  PLT 108*  --  99* 132*   Cardiac Enzymes: No results for input(s): CKTOTAL, CKMB, CKMBINDEX, TROPONINI in the last 168 hours. BNP: Invalid input(s): POCBNP CBG: No results for input(s): GLUCAP in the last 168 hours. D-Dimer No results for input(s): DDIMER in the last 72 hours. Hgb A1c No results for input(s): HGBA1C in the last 72 hours. Lipid Profile No results for input(s): CHOL, HDL, LDLCALC, TRIG, CHOLHDL, LDLDIRECT in the last 72 hours. Thyroid function studies No results for input(s): TSH, T4TOTAL, T3FREE, THYROIDAB in the last 72 hours.  Invalid input(s): FREET3 Anemia work up No results for input(s): VITAMINB12, FOLATE, FERRITIN, TIBC, IRON, RETICCTPCT in the last 72 hours. Urinalysis No results found for: COLORURINE, APPEARANCEUR, LABSPEC, PHURINE, GLUCOSEU, HGBUR, BILIRUBINUR, KETONESUR, PROTEINUR, UROBILINOGEN, NITRITE, LEUKOCYTESUR Sepsis Labs Invalid input(s): PROCALCITONIN,  WBC,  LACTICIDVEN Microbiology Recent Results (from the past 240 hour(s))  Group A Strep by PCR     Status: None   Collection Time: 08/07/18 12:10 PM  Result Value Ref Range Status   Group A Strep by PCR NOT DETECTED NOT DETECTED Final    Comment: Performed at [facility], [address], [place], [place] [address]  Surgical pcr screen     Status: Abnormal   Collection Time: 08/08/18  7:36 PM  Result Value Ref Range Status   MRSA, PCR NEGATIVE NEGATIVE Final   Staphylococcus  aureus POSITIVE (A) NEGATIVE Final    Comment: (NOTE) The Xpert SA Assay (FDA approved for NASAL specimens in patients 24 years of age and older), is one component of a comprehensive surveillance program. It is not intended to diagnose infection nor to guide or monitor treatment. Performed at [facility], [address] [place], [place] [address]    Time spent: 30 min  SIGNED:   Rickey Barbara, MD  [facility] 08/10/2018, 12:31 PM  If 7PM-7AM, please contact night-coverage

## 2018-08-10 NOTE — Progress Notes (Signed)
Patient provided with good rx card and coupon.

## 2019-08-07 ENCOUNTER — Other Ambulatory Visit: Payer: Self-pay

## 2019-08-07 ENCOUNTER — Telehealth: Payer: Self-pay | Admitting: *Deleted

## 2019-08-07 DIAGNOSIS — Z20822 Contact with and (suspected) exposure to covid-19: Secondary | ICD-10-CM

## 2019-08-07 NOTE — Telephone Encounter (Signed)
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

## 2019-08-07 NOTE — Telephone Encounter (Signed)
[employer] HR requested clinic set up covid testing for employee. He was with friends/family this past weekend, 10/31. On 11/1, his father (same household) began having sx. Father was seen at [facility] and had Covid test performed (on Day 2 of his sx) that was negative. Pt then began having sx on 11/2, cold like sx with muscle aches.   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 Was planning for testing on Day 5 of sx, Friday 08/08/19. However a covid test from [facility] is in [facility] as being performed this morning at [facility]. If test negative and pt's sx are improving and no fever in past 72 hours, pt will be able to return to work on Thursday 08/14/19.  If symptoms worsen or do not improve, pt to contact clinic for further guidelines. Pt's friend serves as Optometrist for other friends/family that work at [employer] and they have requested and agreed to have [place] call friend to relay all testing dates and instructions for them. HR manager given scheduling details, instructions to contact clinic if sx worsen or do not improve, as well as ER precautions.

## 2019-08-09 LAB — NOVEL CORONAVIRUS, NAA: SARS-CoV-2, NAA: NOT DETECTED

## 2019-08-15 ENCOUNTER — Telehealth: Payer: Self-pay | Admitting: General Practice

## 2019-08-15 NOTE — Telephone Encounter (Signed)
Negative COVID results given. Patient results "NOT Detected." Caller expressed understanding. ° °

## 2019-10-21 ENCOUNTER — Telehealth: Payer: Self-pay | Admitting: *Deleted

## 2019-10-21 ENCOUNTER — Encounter: Payer: Self-pay | Admitting: *Deleted

## 2019-10-21 NOTE — Telephone Encounter (Signed)
RN notified on 1/18 by HR manager Salley Slaughter of pt being around potentially positive/symptomatic person. Spoke with pt over phone and he reports being in same car as symptomatic coworker each evening last week. Last exposure night of 10/17/19. Coworker began having sx 1/17, within 48hr virus shedding period. Pt denies any current sx.   Advised pt to follow 14 day asymptomatic with exposure to symptomatic/potentially positive person quarantine per CDC recommendations. Advised pt that Day 1 of quarantine is 1/16. Plan for testing on Day 7, date 10/24/19. Pt lives in [place]. [facility] campus closet testing site for pt. Appt made for 10/24/19 at 12:15p. Reviewed drive up testing instructions and directions with pt, ie attend appt time, remain in vehicle, wear mask, results back in 2-3 days. Will f/u pt on 1/25 with anticipated results. If no sx develop and no fever in previous >24hrs without antipyretics, pt to complete quarantine 1/29 and return to work 11/01/19.    Reviewed possible Covid sx including cough, ShOB, sinusitis sx, sore throat, fever/chills, body aches, fatigue, loss of taste/smell, GI sx n/v/d. Also reviewed same day/emergent eval/ER precautions of dizziness/syncope, confusion, blue tint to lips/face, severe ShOB/difficulty breathing.    Pt verbalizes understanding and agreement with plan of care. No further questions/concerns at this time. Pt reminded to contact clinic with any changes in sx or questions/concerns.

## 2019-10-21 NOTE — Telephone Encounter (Signed)
Noted agreed with plan of care per CDC guidelines 

## 2019-10-24 ENCOUNTER — Ambulatory Visit: Payer: Self-pay | Attending: Internal Medicine

## 2019-10-24 DIAGNOSIS — Z20822 Contact with and (suspected) exposure to covid-19: Secondary | ICD-10-CM

## 2019-10-25 ENCOUNTER — Encounter: Payer: Self-pay | Admitting: *Deleted

## 2019-10-25 LAB — NOVEL CORONAVIRUS, NAA: SARS-CoV-2, NAA: NOT DETECTED

## 2019-10-25 NOTE — Telephone Encounter (Signed)
covid pcr test negative.  Noted in care everywhere patient seen at urgent care today and given clindamycin Rx for 7 days.  HR [employer] Tim notified negative test.  Patient does not have mychart.  Attempted to notify him of results via telephone reached voicemail.  Message left  Your covid test results were negative. Continue quarantine at home and monitoring for symptoms of covid e.g. runny nose, sore throat, headache, loss of taste/smell, nausea/vomiting/diarrhea, fever/chills, body aches, cough, shortness of breath/dyspnea.   Please notify clinic staff if symptoms develop.  RN Rolly Salter in clinic M, Tu, Th and Fr at extension [phone] and I am available through mychart or [email or web address].  Quarantine to end  10/31/2019 and return to work 11/01/2019 as long as symptom free continues and no new known covid close positive contacts.Covid spread and incidence in community high.  Continue to protect self with mask wear, hand sanitizing washing, avoid touching face and maintaining social distancing 6 feet or greater.  [employer] HR Tim notified of negative test result.

## 2019-10-26 NOTE — Telephone Encounter (Signed)
Patient did not return mychart message or email please contact him to ensure he read/listened to my messages and if he has further questions/concerns including reason why started on clindamycin as not able to read UC note in Epic.

## 2019-10-27 NOTE — Telephone Encounter (Signed)
Spoke with pt over phone this morning. He reports he still has no symptoms. He sts he is unaware of an abx prescription being ordered and sts he was not seen at Urgent care recently. RN advised pt that office will be contacted to determine if that is a recent prescription vs erroneous charting/order vs historical prescription that was pulled in through med rec. Pt verbalizes agreement with this. Mesg left on nurse line at [facility] requesting return call and clarification. Will contact pt again with update. He still eligible to return to work onsite on 11/01/19 or next scheduled work date. Denies further questions/concerns.

## 2019-10-27 NOTE — Telephone Encounter (Signed)
Noted patient denied recent urgent care visit or new Rx

## 2019-11-02 ENCOUNTER — Encounter: Payer: Self-pay | Admitting: *Deleted

## 2019-11-02 NOTE — Telephone Encounter (Signed)
Telephone message left for patient just following up to see that he did not have any new symptoms or fever and if he returned to work as planned yesterday.  Noted that RN Rolly Salter may be contacting him tomorrow since I was unable to speak with him today.

## 2019-11-03 NOTE — Telephone Encounter (Signed)
noted 

## 2019-11-03 NOTE — Telephone Encounter (Signed)
Confirmed with HR that pt is returning to work on-site today, 2/1 on 2nd shift, as expected.

## 2019-11-11 NOTE — Telephone Encounter (Signed)
Per HR patient returned to work as expected.

## 2020-08-11 ENCOUNTER — Telehealth: Payer: Self-pay | Admitting: Registered Nurse

## 2020-08-11 DIAGNOSIS — K5903 Drug induced constipation: Secondary | ICD-10-CM

## 2020-08-11 NOTE — Telephone Encounter (Signed)
Notified by HR patient called out sick today and yesterday and requested clinic staff follow up with patient to determine if covid testing required prior to RTW.  Left messages at [phone] and [phone] requested that patient contact clinic in am at extension [phone].

## 2020-08-12 ENCOUNTER — Encounter (HOSPITAL_COMMUNITY): Payer: Self-pay

## 2020-08-12 ENCOUNTER — Inpatient Hospital Stay (HOSPITAL_COMMUNITY)
Admission: EM | Admit: 2020-08-12 | Discharge: 2020-08-14 | DRG: 340 | Disposition: A | Discharge status: Home / Self Care | Payer: No Typology Code available for payment source | Attending: Surgery | Admitting: Surgery

## 2020-08-12 ENCOUNTER — Emergency Department (HOSPITAL_COMMUNITY): Payer: No Typology Code available for payment source

## 2020-08-12 ENCOUNTER — Ambulatory Visit (HOSPITAL_COMMUNITY)
Admission: EM | Admit: 2020-08-12 | Discharge: 2020-08-12 | Disposition: A | Discharge status: Home / Self Care | Payer: Self-pay | Attending: Urgent Care | Admitting: Urgent Care

## 2020-08-12 DIAGNOSIS — R1031 Right lower quadrant pain: Secondary | ICD-10-CM

## 2020-08-12 DIAGNOSIS — F17210 Nicotine dependence, cigarettes, uncomplicated: Secondary | ICD-10-CM | POA: Diagnosis present

## 2020-08-12 DIAGNOSIS — K3532 Acute appendicitis with perforation and localized peritonitis, without abscess: Principal | ICD-10-CM | POA: Diagnosis present

## 2020-08-12 DIAGNOSIS — R198 Other specified symptoms and signs involving the digestive system and abdomen: Secondary | ICD-10-CM

## 2020-08-12 DIAGNOSIS — R1013 Epigastric pain: Secondary | ICD-10-CM

## 2020-08-12 DIAGNOSIS — Z20822 Contact with and (suspected) exposure to covid-19: Secondary | ICD-10-CM | POA: Diagnosis present

## 2020-08-12 LAB — CBC
HCT: 40.9 % (ref 39.0–52.0)
Hemoglobin: 12.7 g/dL — ABNORMAL LOW (ref 13.0–17.0)
MCH: 25.9 pg — ABNORMAL LOW (ref 26.0–34.0)
MCHC: 31.1 g/dL (ref 30.0–36.0)
MCV: 83.3 fL (ref 80.0–100.0)
Platelets: 128 10*3/uL — ABNORMAL LOW (ref 150–400)
RBC: 4.91 MIL/uL (ref 4.22–5.81)
RDW: 12.9 % (ref 11.5–15.5)
WBC: 10.9 10*3/uL — ABNORMAL HIGH (ref 4.0–10.5)
nRBC: 0 % (ref 0.0–0.2)

## 2020-08-12 LAB — COMPREHENSIVE METABOLIC PANEL
ALT: 15 U/L (ref 0–44)
AST: 18 U/L (ref 15–41)
Albumin: 4.1 g/dL (ref 3.5–5.0)
Alkaline Phosphatase: 54 U/L (ref 38–126)
Anion gap: 12 (ref 5–15)
BUN: 14 mg/dL (ref 6–20)
CO2: 25 mmol/L (ref 22–32)
Calcium: 9.3 mg/dL (ref 8.9–10.3)
Chloride: 99 mmol/L (ref 98–111)
Creatinine, Ser: 0.96 mg/dL (ref 0.61–1.24)
GFR, Estimated: >60 mL/min (ref >60)
Glucose, Bld: 124 mg/dL — ABNORMAL HIGH (ref 70–99)
Potassium: 3.9 mmol/L (ref 3.5–5.1)
Sodium: 136 mmol/L (ref 135–145)
Total Bilirubin: 1 mg/dL (ref 0.3–1.2)
Total Protein: 7.5 g/dL (ref 6.5–8.1)

## 2020-08-12 LAB — RESPIRATORY PANEL BY RT PCR (FLU A&B, COVID)
Influenza A by PCR: NEGATIVE
Influenza B by PCR: NEGATIVE
SARS Coronavirus 2 by RT PCR: NEGATIVE

## 2020-08-12 LAB — LIPASE, BLOOD: Lipase: 24 U/L (ref 11–51)

## 2020-08-12 MED ORDER — SODIUM CHLORIDE 0.9 % IV BOLUS
1000.0000 mL | Freq: Once | INTRAVENOUS | Status: AC
  Administered 2020-08-12: 1000 mL via INTRAVENOUS

## 2020-08-12 MED ORDER — ACETAMINOPHEN 650 MG RE SUPP: 650.0000 mg | Freq: Four times a day (QID) | RECTAL | Status: DC | PRN

## 2020-08-12 MED ORDER — DOCUSATE SODIUM 100 MG PO CAPS
100.0000 mg | ORAL_CAPSULE | Freq: Two times a day (BID) | ORAL | Status: DC
  Administered 2020-08-13 – 2020-08-14 (×2): 100 mg via ORAL
  Filled 2020-08-12 (×2): qty 1

## 2020-08-12 MED ORDER — ONDANSETRON HCL 4 MG/2ML IJ SOLN
4.0000 mg | Freq: Four times a day (QID) | INTRAMUSCULAR | Status: DC | PRN
  Administered 2020-08-13: 4 mg via INTRAVENOUS

## 2020-08-12 MED ORDER — METRONIDAZOLE IN NACL 5-0.79 MG/ML-% IV SOLN
500.0000 mg | Freq: Once | INTRAVENOUS | Status: AC
  Administered 2020-08-12: 500 mg via INTRAVENOUS
  Filled 2020-08-12: qty 100

## 2020-08-12 MED ORDER — HYDROCODONE-ACETAMINOPHEN 5-325 MG PO TABS
ORAL_TABLET | ORAL | Status: AC
  Filled 2020-08-12: qty 1

## 2020-08-12 MED ORDER — MORPHINE SULFATE (PF) 4 MG/ML IV SOLN
4.0000 mg | Freq: Once | INTRAVENOUS | Status: AC
  Administered 2020-08-12: 4 mg via INTRAVENOUS
  Filled 2020-08-12: qty 1

## 2020-08-12 MED ORDER — ZOLPIDEM TARTRATE 5 MG PO TABS: 5.0000 mg | ORAL_TABLET | Freq: Every evening | ORAL | Status: DC | PRN

## 2020-08-12 MED ORDER — ACETAMINOPHEN 325 MG PO TABS
650.0000 mg | ORAL_TABLET | Freq: Four times a day (QID) | ORAL | Status: DC | PRN
  Administered 2020-08-14: 650 mg via ORAL
  Filled 2020-08-12 (×2): qty 2

## 2020-08-12 MED ORDER — ONDANSETRON 4 MG PO TBDP: 4.0000 mg | ORAL_TABLET | Freq: Four times a day (QID) | ORAL | Status: DC | PRN

## 2020-08-12 MED ORDER — PROCHLORPERAZINE MALEATE 10 MG PO TABS
10.0000 mg | ORAL_TABLET | Freq: Four times a day (QID) | ORAL | Status: DC | PRN
  Filled 2020-08-12: qty 1

## 2020-08-12 MED ORDER — PIPERACILLIN-TAZOBACTAM 3.375 G IVPB
3.3750 g | Freq: Three times a day (TID) | INTRAVENOUS | Status: DC
  Administered 2020-08-12 – 2020-08-14 (×6): 3.375 g via INTRAVENOUS
  Filled 2020-08-12 (×10): qty 50

## 2020-08-12 MED ORDER — DIPHENHYDRAMINE HCL 50 MG/ML IJ SOLN: 12.5000 mg | Freq: Four times a day (QID) | INTRAMUSCULAR | Status: DC | PRN

## 2020-08-12 MED ORDER — SODIUM CHLORIDE 0.9 % IV SOLN
2.0000 g | Freq: Once | INTRAVENOUS | Status: AC
  Administered 2020-08-12: 2 g via INTRAVENOUS
  Filled 2020-08-12: qty 20

## 2020-08-12 MED ORDER — ENOXAPARIN SODIUM 40 MG/0.4ML ~~LOC~~ SOLN
40.0000 mg | Freq: Every day | SUBCUTANEOUS | Status: DC
  Administered 2020-08-13: 40 mg via SUBCUTANEOUS
  Filled 2020-08-12: qty 0.4

## 2020-08-12 MED ORDER — DIPHENHYDRAMINE HCL 12.5 MG/5ML PO ELIX: 12.5000 mg | ORAL_SOLUTION | Freq: Four times a day (QID) | ORAL | Status: DC | PRN

## 2020-08-12 MED ORDER — IOHEXOL 350 MG/ML SOLN
75.0000 mL | Freq: Once | INTRAVENOUS | Status: AC | PRN
  Administered 2020-08-12: 75 mL via INTRAVENOUS

## 2020-08-12 MED ORDER — KCL-LACTATED RINGERS-D5W 20 MEQ/L IV SOLN
INTRAVENOUS | Status: DC
  Filled 2020-08-12 (×4): qty 1000

## 2020-08-12 MED ORDER — KETOROLAC TROMETHAMINE 30 MG/ML IJ SOLN
30.0000 mg | Freq: Four times a day (QID) | INTRAMUSCULAR | Status: DC | PRN
  Administered 2020-08-14: 30 mg via INTRAVENOUS
  Filled 2020-08-12: qty 1

## 2020-08-12 MED ORDER — ONDANSETRON HCL 4 MG/2ML IJ SOLN
4.0000 mg | Freq: Once | INTRAMUSCULAR | Status: AC
  Administered 2020-08-12: 4 mg via INTRAVENOUS
  Filled 2020-08-12: qty 2

## 2020-08-12 MED ORDER — KETOROLAC TROMETHAMINE 30 MG/ML IJ SOLN
30.0000 mg | Freq: Four times a day (QID) | INTRAMUSCULAR | Status: AC
  Administered 2020-08-13 (×4): 30 mg via INTRAVENOUS
  Filled 2020-08-12 (×4): qty 1

## 2020-08-12 MED ORDER — HYDROMORPHONE HCL 1 MG/ML IJ SOLN
0.5000 mg | INTRAMUSCULAR | Status: DC | PRN
  Administered 2020-08-12: 1 mg via INTRAVENOUS
  Administered 2020-08-13: 0.5 mg via INTRAVENOUS
  Administered 2020-08-14: 1 mg via INTRAVENOUS
  Filled 2020-08-12 (×4): qty 1

## 2020-08-12 MED ORDER — PROCHLORPERAZINE EDISYLATE 10 MG/2ML IJ SOLN: 5.0000 mg | Freq: Four times a day (QID) | INTRAMUSCULAR | Status: DC | PRN

## 2020-08-12 MED ORDER — HYDROCODONE-ACETAMINOPHEN 5-325 MG PO TABS
1.0000 | ORAL_TABLET | Freq: Once | ORAL | Status: AC
  Administered 2020-08-12: 1 via ORAL

## 2020-08-12 NOTE — Telephone Encounter (Signed)
Rozell returned phone call at approx [phone] today. Reported that on Tuesday 11/9 he started having burning pain in his abdomen. It improved yesterday but returned today. He has not taken any home meds for sx. Denies n/v/d. Reports when he tries to eat, burning in his chest and upper abd returns. Is able to tolerate water without difficulty. He reports he is going to the hospital now. RN discussed that sx could be indicative of acid reflux or ulcer and that NP could do a virtual visit to assess but he still sts that he is going to the hospital now.

## 2020-08-12 NOTE — ED Provider Notes (Signed)
[facility] EMERGENCY DEPARTMENT Provider Note   CSN: [id number] Arrival date & time: 08/12/20  1445     History Chief Complaint  Patient presents with  . Abdominal Pain    Robert Kent is a 34 y.o. male without significant past medical history, presenting to the emergency department from urgent care for right lower quadrant abdominal pain that began 2 days ago.  Patient states pain initially began overnight, however he was able to sleep through the night.  That following morning he was having more severe pain that has been progressively worsening.  It is constant and localized to the right lower quadrant.  He has pain when he lays flat on his back and moves his leg.  He has had decreased appetite, some burning epigastric pain when he eats.  Denies fevers, urinary symptoms, respiratory symptoms, nausea or vomiting, diarrhea, constipation.  No history of abdominal surgeries.  The history is provided by the patient.       Past Medical History:  Diagnosis Date  . Subperiosteal abscess of jaw 08/07/2018    Patient Active Problem List   Diagnosis Date Noted  . Dental abscess 08/08/2018  . Acute periapical abscess 08/07/2018    Past Surgical History:  Procedure Laterality Date  . INCISION AND DRAINAGE ABSCESS N/A 08/09/2018   Procedure: INCISION AND DRAINAGE ABSCESS;  Surgeon: [name], DDS;  Location: [facility] OR;  Service: Oral Surgery;  Laterality: N/A;  . NO PAST SURGERIES         Family History  Family history unknown: Yes    Social History   Tobacco Use  . Smoking status: Current Every Day Smoker    Packs/day: 0.25    Years: 10.00    Pack years: 2.50    Types: Cigarettes  . Smokeless tobacco: Never Used  Vaping Use  . Vaping Use: Never used  Substance Use Topics  . Alcohol use: Yes    Comment: no daily drinking, drinks a few times a month  . Drug use: No    Home Medications Prior to Admission medications   Not on File     Allergies    Patient has no known allergies.  Review of Systems   Review of Systems  Constitutional: Positive for appetite change.  Gastrointestinal: Positive for abdominal pain.  All other systems reviewed and are negative.   Physical Exam Updated Vital Signs BP 105/73   Pulse 63   Temp 98.9 F (37.2 C) (Oral)   Resp 12   Ht 5\' 8"  (1.727 m)   Wt 63.5 kg   SpO2 100%   BMI 21.29 kg/m   Physical Exam Vitals and nursing note reviewed.  Constitutional:      Appearance: He is well-developed.     Comments: Patient appears quite uncomfortable.  HENT:     Head: Normocephalic and atraumatic.  Eyes:     Conjunctiva/sclera: Conjunctivae normal.  Cardiovascular:     Rate and Rhythm: Normal rate and regular rhythm.  Pulmonary:     Effort: Pulmonary effort is normal. No respiratory distress.     Breath sounds: Normal breath sounds.  Abdominal:     General: Abdomen is flat. Bowel sounds are normal.     Palpations: Abdomen is soft.     Tenderness: There is abdominal tenderness in the right upper quadrant and right lower quadrant. There is guarding. Positive signs include McBurney's sign.     Comments: Patient is resting with right hip slightly flexed  Skin:  General: Skin is warm.  Neurological:     Mental Status: He is alert.  Psychiatric:        Behavior: Behavior normal.     ED Results / Procedures / Treatments   Labs (all labs ordered are listed, but only abnormal results are displayed) Labs Reviewed  COMPREHENSIVE METABOLIC PANEL - Abnormal; Notable for the following components:      Result Value   Glucose, Bld 124 (*)    All other components within normal limits  CBC - Abnormal; Notable for the following components:   WBC 10.9 (*)    Hemoglobin 12.7 (*)    MCH 25.9 (*)    Platelets 128 (*)    All other components within normal limits  RESPIRATORY PANEL BY RT PCR (FLU A&B, COVID)  LIPASE, BLOOD  URINALYSIS, ROUTINE W REFLEX MICROSCOPIC     EKG None  Radiology CT Abdomen Pelvis W Contrast  Result Date: 08/12/2020 CLINICAL DATA:  Right lower quadrant pain EXAM: CT ABDOMEN AND PELVIS WITH CONTRAST TECHNIQUE: Multidetector CT imaging of the abdomen and pelvis was performed using the standard protocol following bolus administration of intravenous contrast. CONTRAST:  12mL OMNIPAQUE IOHEXOL 350 MG/ML SOLN COMPARISON:  None. FINDINGS: Lower chest: No acute pleural or parenchymal lung disease. Hepatobiliary: No focal liver abnormality is seen. No gallstones, gallbladder wall thickening, or biliary dilatation. Pancreas: Unremarkable. No pancreatic ductal dilatation or surrounding inflammatory changes. Spleen: Normal in size without focal abnormality. Adrenals/Urinary Tract: Adrenal glands are unremarkable. Kidneys are normal, without renal calculi, focal lesion, or hydronephrosis. Duplicated left ureter incidentally noted. Bladder is unremarkable. Stomach/Bowel: Inflamed appendix is seen within the right lower quadrant, measuring up to 2.3 cm in diameter. There is marked appendiceal wall thickening, with evidence of small perforation and focus of extraluminal gas. Multiple appendicoliths are identified. Findings are consistent with acute perforated appendicitis. No fluid collection or abscess. No bowel obstruction or ileus. Vascular/Lymphatic: No significant vascular findings. Small reactive lymph nodes are seen within the right lower quadrant mesentery. No pathologic adenopathy. Reproductive: Prostate is unremarkable. Other: Small amount of free fluid within the right lower quadrant and pelvis. No fluid collection or abscess. As above, small focus of extraluminal gas adjacent to the inflamed appendix, consistent with perforated appendicitis. No abdominal wall hernia. Musculoskeletal: No acute or destructive bony lesions. Reconstructed images demonstrate no additional findings. IMPRESSION: 1. Acute perforated appendicitis, with small focus of  extraluminal gas as above. No fluid collection or abscess. 2. Trace free fluid right lower quadrant and pelvis. Critical Value/emergent results were called by telephone at the time of interpretation on 08/12/2020 at 6:18 pm to provider [place] Allison Deshotels , who verbally acknowledged these results. Electronically Signed   By: Sharlet Salina M.D.   On: 08/12/2020 18:21    Procedures Procedures (including critical care time)  Medications Ordered in ED Medications  cefTRIAXone (ROCEPHIN) 2 g in sodium chloride 0.9 % 100 mL IVPB (2 g Intravenous New Bag/Given 08/12/20 1839)    And  metroNIDAZOLE (FLAGYL) IVPB 500 mg (500 mg Intravenous New Bag/Given 08/12/20 1836)  sodium chloride 0.9 % bolus 1,000 mL (0 mLs Intravenous Stopped 08/12/20 1816)  morphine 4 MG/ML injection 4 mg (4 mg Intravenous Given 08/12/20 1639)  ondansetron (ZOFRAN) injection 4 mg (4 mg Intravenous Given 08/12/20 1640)  iohexol (OMNIPAQUE) 350 MG/ML injection 75 mL (75 mLs Intravenous Contrast Given 08/12/20 1745)    ED Course  I have reviewed the triage vital signs and the nursing notes.  Pertinent labs & imaging  results that were available during my care of the patient were reviewed by me and considered in my medical decision making (see chart for details).  Clinical Course as of Aug 12 1856  Thu Aug 12, 2020  0981 Microperforation no abscess or fluid collection.   [JR]    Clinical Course User Index [JR] Esias Mory, [place] N, PA-C   MDM Rules/Calculators/A&P                          Patient presenting from urgent care for 2 days of right lower quadrant abdominal pain concerning for acute appendicitis.  No urinary symptoms or systemic symptoms.  No history of abdominal surgeries.  Patient is afebrile with stable vital signs.  On exam, he is focally tender in the right abdomen specifically in the lower quadrant with some guarding.  Labs obtained in triage with CBC resulting with leukocytosis of 10.9.  Covid swab ordered, CT  abdomen pelvis ordered to evaluate for appendicitis.  IV fluids, pain medication antiemetics ordered.  CT with acute appendicitis with microperforation, no evidence of abscess.  Patient symptoms are well managed.  IV antibiotics ordered, Rocephin and Flagyl.  Consulted with general surgeon Dr. Donell Beers, will admit and further manage operatively.  Final Clinical Impression(s) / ED Diagnoses Final diagnoses:  Acute appendicitis with perforation and localized peritonitis, without abscess or gangrene    Rx / DC Orders ED Discharge Orders    None       Micky Overturf, [place] N, PA-C 08/12/20 2136    Pricilla Loveless, MD 08/13/20 [phone]

## 2020-08-12 NOTE — H&P (Signed)
Robert Kent RogerKumar Weidner is an 34 y.o. male.   Chief Complaint: abdominal pain HPI:  Pt is a [age] yo M who presents with 2 days of worsening RLQ abdominal pain.  It did not wake him up from sleep, but it was severe upon awakening 2 days ago.  He has gradually lost his appetite.  He has had some nausea, but no emesis.  He felt like he had fever/chills earlier, but not today.  He also reported some epigastric pain that came on today. He denies any sick contacts or different food.  He hasn't wanted anything to eat today.  He is normally well and has no medical problems at this point.    Past Medical History:  Diagnosis Date  . Subperiosteal abscess of jaw 08/07/2018    Past Surgical History:  Procedure Laterality Date  . INCISION AND DRAINAGE ABSCESS N/A 08/09/2018   Procedure: INCISION AND DRAINAGE ABSCESS;  Surgeon: Ocie DoyneJensen, Scott, DDS;  Location: [facility] OR;  Service: Oral Surgery;  Laterality: N/A;  . NO PAST SURGERIES      Family History  Family history unknown: Yes   Social History:  reports that he has been smoking cigarettes. He has a 2.50 pack-year smoking history. He has never used smokeless tobacco. He reports current alcohol use. He reports that he does not use drugs.  Allergies: No Known Allergies  Meds:  None  Results for orders placed or performed during the hospital encounter of 08/12/20 (from the past 48 hour(s))  Lipase, blood     Status: None   Collection Time: 08/12/20  3:18 PM  Result Value Ref Range   Lipase 24 11 - 51 U/L    Comment: Performed at [facility], [address] [place], [place] [address]  Comprehensive metabolic panel     Status: Abnormal   Collection Time: 08/12/20  3:18 PM  Result Value Ref Range   Sodium 136 135 - 145 mmol/L   Potassium 3.9 3.5 - 5.1 mmol/L   Chloride 99 98 - 111 mmol/L   CO2 25 22 - 32 mmol/L   Glucose, Bld 124 (H) 70 - 99 mg/dL    Comment: Glucose reference range applies only to samples taken after fasting for at least 8 hours.    BUN 14 6 - 20 mg/dL   Creatinine, Ser 1.910.96 0.61 - 1.24 mg/dL   Calcium 9.3 8.9 - 47.810.3 mg/dL   Total Protein 7.5 6.5 - 8.1 g/dL   Albumin 4.1 3.5 - 5.0 g/dL   AST 18 15 - 41 U/L   ALT 15 0 - 44 U/L   Alkaline Phosphatase 54 38 - 126 U/L   Total Bilirubin 1.0 0.3 - 1.2 mg/dL   GFR, Estimated >29>60 >56>60 mL/min    Comment: (NOTE) Calculated using the CKD-EPI Creatinine Equation (2021)    Anion gap 12 5 - 15    Comment: Performed at [facility], [address] [place], [place] [address]  CBC     Status: Abnormal   Collection Time: 08/12/20  3:18 PM  Result Value Ref Range   WBC 10.9 (H) 4.0 - 10.5 K/uL   RBC 4.91 4.22 - 5.81 MIL/uL   Hemoglobin 12.7 (L) 13.0 - 17.0 g/dL   HCT 65.740.9 39 - 52 %   MCV 83.3 80.0 - 100.0 fL   MCH 25.9 (L) 26.0 - 34.0 pg   MCHC 31.1 30.0 - 36.0 g/dL   RDW 84.612.9 96.211.5 - 95.215.5 %   Platelets  128 (L) 150 - 400 K/uL    Comment: REPEATED TO VERIFY   nRBC 0.0 0.0 - 0.2 %    Comment: Performed at [facility], [address], [place], [place] [address]  Respiratory Panel by RT PCR (Flu A&B, Covid) - Nasopharyngeal Swab     Status: None   Collection Time: 08/12/20  4:31 PM   Specimen: Nasopharyngeal Swab  Result Value Ref Range   SARS Coronavirus 2 by RT PCR NEGATIVE NEGATIVE    Comment: (NOTE) SARS-CoV-2 target nucleic acids are NOT DETECTED.  The SARS-CoV-2 RNA is generally detectable in upper respiratoy specimens during the acute phase of infection. The lowest concentration of SARS-CoV-2 viral copies this assay can detect is 131 copies/mL. A negative result does not preclude SARS-Cov-2 infection and should not be used as the sole basis for treatment or other patient management decisions. A negative result may occur with  improper specimen collection/handling, submission of specimen other than nasopharyngeal swab, presence of viral mutation(s) within the areas targeted by this assay, and inadequate number of viral copies (<131 copies/mL). A  negative result must be combined with clinical observations, patient history, and epidemiological information. The expected result is Negative.  Fact Sheet for Patients:  https://www.moore.com/  Fact Sheet for Healthcare Providers:  https://www.young.biz/  This test is no t yet approved or cleared by the [place] FDA and  has been authorized for detection and/or diagnosis of SARS-CoV-2 by FDA under an Emergency Use Authorization (EUA). This EUA will remain  in effect (meaning this test can be used) for the duration of the COVID-19 declaration under Section 564(b)(1) of the Act, 21 U.S.C. section 360bbb-3(b)(1), unless the authorization is terminated or revoked sooner.     Influenza A by PCR NEGATIVE NEGATIVE   Influenza B by PCR NEGATIVE NEGATIVE    Comment: (NOTE) The Xpert Xpress SARS-CoV-2/FLU/RSV assay is intended as an aid in  the diagnosis of influenza from Nasopharyngeal swab specimens and  should not be used as a sole basis for treatment. Nasal washings and  aspirates are unacceptable for Xpert Xpress SARS-CoV-2/FLU/RSV  testing.  Fact Sheet for Patients: https://www.moore.com/  Fact Sheet for Healthcare Providers: https://www.young.biz/  This test is not yet approved or cleared by the [place] FDA and  has been authorized for detection and/or diagnosis of SARS-CoV-2 by  FDA under an Emergency Use Authorization (EUA). This EUA will remain  in effect (meaning this test can be used) for the duration of the  Covid-19 declaration under Section 564(b)(1) of the Act, 21  U.S.C. section 360bbb-3(b)(1), unless the authorization is  terminated or revoked. Performed at [facility], [address] [place], [place] [address]    CT Abdomen Pelvis W Contrast  Result Date: 08/12/2020 CLINICAL DATA:  Right lower quadrant pain EXAM: CT ABDOMEN AND PELVIS WITH CONTRAST TECHNIQUE:  Multidetector CT imaging of the abdomen and pelvis was performed using the standard protocol following bolus administration of intravenous contrast. CONTRAST:  68mL OMNIPAQUE IOHEXOL 350 MG/ML SOLN COMPARISON:  None. FINDINGS: Lower chest: No acute pleural or parenchymal lung disease. Hepatobiliary: No focal liver abnormality is seen. No gallstones, gallbladder wall thickening, or biliary dilatation. Pancreas: Unremarkable. No pancreatic ductal dilatation or surrounding inflammatory changes. Spleen: Normal in size without focal abnormality. Adrenals/Urinary Tract: Adrenal glands are unremarkable. Kidneys are normal, without renal calculi, focal lesion, or hydronephrosis. Duplicated left ureter incidentally noted. Bladder is unremarkable. Stomach/Bowel: Inflamed appendix is seen within the right lower quadrant, measuring up to  2.3 cm in diameter. There is marked appendiceal wall thickening, with evidence of small perforation and focus of extraluminal gas. Multiple appendicoliths are identified. Findings are consistent with acute perforated appendicitis. No fluid collection or abscess. No bowel obstruction or ileus. Vascular/Lymphatic: No significant vascular findings. Small reactive lymph nodes are seen within the right lower quadrant mesentery. No pathologic adenopathy. Reproductive: Prostate is unremarkable. Other: Small amount of free fluid within the right lower quadrant and pelvis. No fluid collection or abscess. As above, small focus of extraluminal gas adjacent to the inflamed appendix, consistent with perforated appendicitis. No abdominal wall hernia. Musculoskeletal: No acute or destructive bony lesions. Reconstructed images demonstrate no additional findings. IMPRESSION: 1. Acute perforated appendicitis, with small focus of extraluminal gas as above. No fluid collection or abscess. 2. Trace free fluid right lower quadrant and pelvis. Critical Value/emergent results were called by telephone at the time of  interpretation on 08/12/2020 at 6:18 pm to provider [place] ROBINSON , who verbally acknowledged these results. Electronically Signed   By: Sharlet Salina M.D.   On: 08/12/2020 18:21    Review of Systems  Constitutional: Negative.   HENT: Negative.   Eyes: Negative.   Respiratory: Negative.   Cardiovascular: Negative.   Gastrointestinal: Positive for abdominal pain and nausea. Negative for abdominal distention, anal bleeding, blood in stool, constipation, diarrhea and vomiting.  Endocrine: Negative.   Genitourinary: Negative.   Musculoskeletal: Negative.   Skin: Negative.   Allergic/Immunologic: Negative.   Neurological: Negative.   Hematological: Negative.   Psychiatric/Behavioral: Negative.   All other systems reviewed and are negative.   Blood pressure 103/65, pulse 74, temperature 98.9 F (37.2 C), temperature source Oral, resp. rate 16, height 5\' 8"  (1.727 m), weight 63.5 kg, SpO2 100 %. Physical Exam Vitals reviewed.  Constitutional:      General: He is in acute distress (mild).     Appearance: He is well-developed and normal weight. He is not ill-appearing, toxic-appearing or diaphoretic.  HENT:     Head: Normocephalic and atraumatic.     Mouth/Throat:     Mouth: Mucous membranes are moist.     Pharynx: Oropharynx is clear.  Eyes:     General: No scleral icterus.    Extraocular Movements: Extraocular movements intact.     Pupils: Pupils are equal, round, and reactive to light.  Cardiovascular:     Rate and Rhythm: Normal rate and regular rhythm.     Heart sounds: Normal heart sounds. No murmur heard.  No friction rub. No gallop.   Pulmonary:     Effort: Pulmonary effort is normal.     Breath sounds: Normal breath sounds.  Abdominal:     General: Abdomen is flat and scaphoid. Bowel sounds are decreased. There is no distension or abdominal bruit. There are no signs of injury.     Palpations: Abdomen is soft. There is no fluid wave, hepatomegaly, splenomegaly or mass.      Tenderness: There is abdominal tenderness in the right lower quadrant. There is guarding. Positive signs include Rovsing's sign.     Hernia: No hernia is present.  Skin:    General: Skin is warm and dry.     Capillary Refill: Capillary refill takes 2 to 3 seconds.     Coloration: Skin is not cyanotic, jaundiced, mottled or pale.     Findings: No erythema or rash.  Neurological:     General: No focal deficit present.     Mental Status: He is alert and oriented to  person, place, and time.     Motor: No weakness.  Psychiatric:        Mood and Affect: Mood normal. Mood is not anxious or depressed.        Behavior: Behavior normal.      Assessment/Plan Acute perforated appendicitis with significant adjacent inflammatory changes.   Plan admit to floor IV antibiotics Risk with surgery for delayed abscess or potential for open surgery.   AM team to assess to determine if proceed with early surgery or delayed approach.  Given presence of appendicoliths, will need appy at some point as unlikely to fail completely non operative approach.    Pain control IV fluids.    Almond Lint, MD 08/12/2020, 10:13 PM

## 2020-08-12 NOTE — ED Triage Notes (Signed)
Pt reports having right lower quadrant pain when eating spicy food. States having a burning sensation in the epigastric are x 3 days. Denies chest pain, sob, fever/ Pt has ot take any medications for the complaints.

## 2020-08-12 NOTE — ED Triage Notes (Signed)
Pt arrives to ED w/ c/o 10/10 RLQ abdominal and epigastric pain x 2 days. Pt sent from UC to r/u appendicitis. Pt denies n/v/d. Pt endorses tenderness at RLQ.

## 2020-08-12 NOTE — ED Provider Notes (Signed)
[facility]   MRN: [id number] DOB: 02-09-1986  Subjective:   Robert Kent is a 34 y.o. male presenting for 2-day history of acute onset worsening right lower quadrant pain, fever and chills.  Patient has had decreased appetite.  Also reports pain over the upper midsection.  Denies cough, sore throat, chest pain, shortness of breath, vomiting, diarrhea, constipation.  Patient does eat a lot of spicy foods.  Denies taking chronic medications.    No Known Allergies  Past Medical History:  Diagnosis Date  . Subperiosteal abscess of jaw 08/07/2018     Past Surgical History:  Procedure Laterality Date  . INCISION AND DRAINAGE ABSCESS N/A 08/09/2018   Procedure: INCISION AND DRAINAGE ABSCESS;  Surgeon: [name], DDS;  Location: [facility] OR;  Service: Oral Surgery;  Laterality: N/A;  . NO PAST SURGERIES      Family History  Family history unknown: Yes    Social History   Tobacco Use  . Smoking status: Current Every Day Smoker    Packs/day: 0.25    Years: 10.00    Pack years: 2.50    Types: Cigarettes  . Smokeless tobacco: Never Used  Vaping Use  . Vaping Use: Never used  Substance Use Topics  . Alcohol use: Yes    Comment: no daily drinking, drinks a few times a month  . Drug use: No    ROS   Objective:   Vitals: BP 114/67 (BP Location: Right Arm)   Pulse 85   Temp 97.8 F (36.6 C) (Oral)   Resp 18   SpO2 96%   Physical Exam Constitutional:      General: He is not in acute distress.    Appearance: Normal appearance. He is well-developed. He is not ill-appearing, toxic-appearing or diaphoretic.  HENT:     Head: Normocephalic and atraumatic.     Right Ear: External ear normal.     Left Ear: External ear normal.     Nose: Nose normal.     Mouth/Throat:     Mouth: Mucous membranes are moist.     Pharynx: Oropharynx is clear.  Eyes:     General: No scleral icterus.    Extraocular Movements: Extraocular movements intact.     Pupils:  Pupils are equal, round, and reactive to light.  Cardiovascular:     Rate and Rhythm: Normal rate and regular rhythm.     Heart sounds: Normal heart sounds. No murmur heard.  No friction rub. No gallop.   Pulmonary:     Effort: Pulmonary effort is normal. No respiratory distress.     Breath sounds: Normal breath sounds. No stridor. No wheezing, rhonchi or rales.  Abdominal:     General: Bowel sounds are normal. There is no distension.     Palpations: Abdomen is soft. There is no mass.     Tenderness: There is abdominal tenderness. There is guarding. There is no rebound. Positive signs include McBurney's sign and psoas sign. Negative signs include Murphy's sign.     Comments: Patient has significant difficulty straightening out right leg due to the pain it elicits on right lower quadrant.  Skin:    General: Skin is warm and dry.  Neurological:     Mental Status: He is alert and oriented to person, place, and time.  Psychiatric:        Mood and Affect: Mood normal.        Behavior: Behavior normal.        Thought  Content: Thought content normal.      Assessment and Plan :   PDMP not reviewed this encounter.  1. Right lower quadrant abdominal pain   2. Psoas test positive   3. Tenderness at McBurney's point     Patient needs rule out of appendicitis through a CT scan at the emergency room.  Discussed this with patient.  Hydrocodone given in clinic for his pain.  Contracts for safety, his brother will take him to the ER now.   Wallis Bamberg, PA-C 08/12/20 1433

## 2020-08-12 NOTE — Telephone Encounter (Signed)
Attempted to contact pt also. No answer. LVMRCB.

## 2020-08-12 NOTE — ED Notes (Signed)
Pt in CT.

## 2020-08-12 NOTE — Discharge Instructions (Signed)
We have given you hydrocodone to help with your pain. However, you need to go to the emergency room now for a CT scan to rule out appendicitis or an acute abdomen. Please have your brother take you there now.

## 2020-08-12 NOTE — Telephone Encounter (Signed)
Patient seen at [facility] and sent to ED for CT scan abdomen today diagnosed acute appendicitis with microperforation, no evidence of abscess.  Patient symptoms are well managed.  IV antibiotics ordered, Rocephin and Flagyl.  Consult with general surgeon Dr. Donell Beers, will admit and further manage operatively.

## 2020-08-13 ENCOUNTER — Encounter (HOSPITAL_COMMUNITY): Payer: Self-pay

## 2020-08-13 ENCOUNTER — Inpatient Hospital Stay (HOSPITAL_COMMUNITY): Payer: No Typology Code available for payment source | Admitting: Certified Registered Nurse Anesthetist

## 2020-08-13 ENCOUNTER — Encounter (HOSPITAL_COMMUNITY): Admission: EM | Disposition: A | Discharge status: Home / Self Care | Payer: Self-pay | Source: Home / Self Care

## 2020-08-13 ENCOUNTER — Other Ambulatory Visit: Payer: Self-pay

## 2020-08-13 HISTORY — PX: LAPAROSCOPIC APPENDECTOMY: SHX408

## 2020-08-13 LAB — CBC
HCT: 35.6 % — ABNORMAL LOW (ref 39.0–52.0)
Hemoglobin: 11.7 g/dL — ABNORMAL LOW (ref 13.0–17.0)
MCH: 26.8 pg (ref 26.0–34.0)
MCHC: 32.9 g/dL (ref 30.0–36.0)
MCV: 81.7 fL (ref 80.0–100.0)
Platelets: 108 10*3/uL — ABNORMAL LOW (ref 150–400)
RBC: 4.36 MIL/uL (ref 4.22–5.81)
RDW: 12.9 % (ref 11.5–15.5)
WBC: 8.6 10*3/uL (ref 4.0–10.5)
nRBC: 0 % (ref 0.0–0.2)

## 2020-08-13 LAB — BASIC METABOLIC PANEL
Anion gap: 9 (ref 5–15)
BUN: 12 mg/dL (ref 6–20)
CO2: 26 mmol/L (ref 22–32)
Calcium: 8.7 mg/dL — ABNORMAL LOW (ref 8.9–10.3)
Chloride: 102 mmol/L (ref 98–111)
Creatinine, Ser: 1.02 mg/dL (ref 0.61–1.24)
GFR, Estimated: >60 mL/min (ref >60)
Glucose, Bld: 112 mg/dL — ABNORMAL HIGH (ref 70–99)
Potassium: 3.6 mmol/L (ref 3.5–5.1)
Sodium: 137 mmol/L (ref 135–145)

## 2020-08-13 LAB — HIV ANTIBODY (ROUTINE TESTING W REFLEX): HIV Screen 4th Generation wRfx: NONREACTIVE

## 2020-08-13 SURGERY — APPENDECTOMY, LAPAROSCOPIC
Anesthesia: General | Site: Abdomen

## 2020-08-13 MED ORDER — ONDANSETRON HCL 4 MG/2ML IJ SOLN
INTRAMUSCULAR | Status: AC
  Filled 2020-08-13: qty 2

## 2020-08-13 MED ORDER — MIDAZOLAM HCL 2 MG/2ML IJ SOLN
INTRAMUSCULAR | Status: DC | PRN
  Administered 2020-08-13: 2 mg via INTRAVENOUS

## 2020-08-13 MED ORDER — 0.9 % SODIUM CHLORIDE (POUR BTL) OPTIME
TOPICAL | Status: DC | PRN
  Administered 2020-08-13: 1000 mL

## 2020-08-13 MED ORDER — DEXAMETHASONE SODIUM PHOSPHATE 10 MG/ML IJ SOLN
INTRAMUSCULAR | Status: DC | PRN
  Administered 2020-08-13: 10 mg via INTRAVENOUS

## 2020-08-13 MED ORDER — SODIUM CHLORIDE 0.9 % IR SOLN
Status: DC | PRN
  Administered 2020-08-13: 1000 mL

## 2020-08-13 MED ORDER — FENTANYL CITRATE (PF) 250 MCG/5ML IJ SOLN
INTRAMUSCULAR | Status: AC
  Filled 2020-08-13: qty 5

## 2020-08-13 MED ORDER — CHLORHEXIDINE GLUCONATE 0.12 % MT SOLN
15.0000 mL | Freq: Once | OROMUCOSAL | Status: AC
  Administered 2020-08-13: 15 mL via OROMUCOSAL
  Filled 2020-08-13: qty 15

## 2020-08-13 MED ORDER — BUPIVACAINE HCL (PF) 0.25 % IJ SOLN
INTRAMUSCULAR | Status: DC | PRN
  Administered 2020-08-13: 20 mL

## 2020-08-13 MED ORDER — ROCURONIUM BROMIDE 10 MG/ML (PF) SYRINGE
PREFILLED_SYRINGE | INTRAVENOUS | Status: AC
  Filled 2020-08-13: qty 10

## 2020-08-13 MED ORDER — PIPERACILLIN-TAZOBACTAM 3.375 G IVPB 30 MIN
3.3750 g | INTRAVENOUS | Status: DC
  Filled 2020-08-13: qty 50

## 2020-08-13 MED ORDER — ROCURONIUM BROMIDE 10 MG/ML (PF) SYRINGE
PREFILLED_SYRINGE | INTRAVENOUS | Status: DC | PRN
  Administered 2020-08-13: 50 mg via INTRAVENOUS

## 2020-08-13 MED ORDER — PROPOFOL 10 MG/ML IV BOLUS
INTRAVENOUS | Status: DC | PRN
  Administered 2020-08-13: 130 mg via INTRAVENOUS

## 2020-08-13 MED ORDER — SUGAMMADEX SODIUM 200 MG/2ML IV SOLN
INTRAVENOUS | Status: DC | PRN
  Administered 2020-08-13: 200 mg via INTRAVENOUS

## 2020-08-13 MED ORDER — DEXAMETHASONE SODIUM PHOSPHATE 10 MG/ML IJ SOLN
INTRAMUSCULAR | Status: AC
  Filled 2020-08-13: qty 1

## 2020-08-13 MED ORDER — BUPIVACAINE HCL (PF) 0.25 % IJ SOLN
INTRAMUSCULAR | Status: AC
  Filled 2020-08-13: qty 30

## 2020-08-13 MED ORDER — FENTANYL CITRATE (PF) 250 MCG/5ML IJ SOLN
INTRAMUSCULAR | Status: DC | PRN
  Administered 2020-08-13 (×2): 50 ug via INTRAVENOUS

## 2020-08-13 MED ORDER — LIDOCAINE 2% (20 MG/ML) 5 ML SYRINGE
INTRAMUSCULAR | Status: AC
  Filled 2020-08-13: qty 5

## 2020-08-13 MED ORDER — OXYCODONE HCL 5 MG PO TABS
5.0000 mg | ORAL_TABLET | ORAL | Status: DC | PRN
  Administered 2020-08-14: 5 mg via ORAL
  Filled 2020-08-13: qty 1

## 2020-08-13 MED ORDER — PROPOFOL 10 MG/ML IV BOLUS
INTRAVENOUS | Status: AC
  Filled 2020-08-13: qty 40

## 2020-08-13 MED ORDER — MIDAZOLAM HCL 2 MG/2ML IJ SOLN
INTRAMUSCULAR | Status: AC
  Filled 2020-08-13: qty 2

## 2020-08-13 MED ORDER — LACTATED RINGERS IV SOLN: INTRAVENOUS | Status: DC

## 2020-08-13 MED ORDER — LIDOCAINE 2% (20 MG/ML) 5 ML SYRINGE
INTRAMUSCULAR | Status: DC | PRN
  Administered 2020-08-13: 40 mg via INTRAVENOUS

## 2020-08-13 SURGICAL SUPPLY — 46 items
APPLIER CLIP ROT 10 11.4 M/L (STAPLE)
BLADE CLIPPER SURG (BLADE) IMPLANT
CANISTER SUCT 3000ML PPV (MISCELLANEOUS) IMPLANT
CHLORAPREP W/TINT 26 (MISCELLANEOUS) ×3 IMPLANT
CLIP APPLIE ROT 10 11.4 M/L (STAPLE) IMPLANT
COVER SURGICAL LIGHT HANDLE (MISCELLANEOUS) ×3 IMPLANT
COVER WAND RF STERILE (DRAPES) IMPLANT
CUTTER FLEX LINEAR 45M (STAPLE) ×3 IMPLANT
DERMABOND ADHESIVE PROPEN (GAUZE/BANDAGES/DRESSINGS) ×2
DERMABOND ADVANCED (GAUZE/BANDAGES/DRESSINGS) ×2
DERMABOND ADVANCED .7 DNX12 (GAUZE/BANDAGES/DRESSINGS) ×1 IMPLANT
DERMABOND ADVANCED .7 DNX6 (GAUZE/BANDAGES/DRESSINGS) ×1 IMPLANT
ELECT CAUTERY BLADE 6.4 (BLADE) ×3 IMPLANT
ELECT REM PT RETURN 9FT ADLT (ELECTROSURGICAL) ×3
ELECTRODE REM PT RTRN 9FT ADLT (ELECTROSURGICAL) ×1 IMPLANT
ENDOLOOP SUT PDS II  0 18 (SUTURE)
ENDOLOOP SUT PDS II 0 18 (SUTURE) IMPLANT
GLOVE BIO SURGEON STRL SZ 6.5 (GLOVE) ×2 IMPLANT
GLOVE BIO SURGEONS STRL SZ 6.5 (GLOVE) ×1
GLOVE BIOGEL PI IND STRL 6 (GLOVE) ×1 IMPLANT
GLOVE BIOGEL PI INDICATOR 6 (GLOVE) ×2
GOWN STRL REUS W/ TWL LRG LVL3 (GOWN DISPOSABLE) ×2 IMPLANT
GOWN STRL REUS W/TWL LRG LVL3 (GOWN DISPOSABLE) ×4
KIT BASIN OR (CUSTOM PROCEDURE TRAY) ×3 IMPLANT
KIT TURNOVER KIT B (KITS) ×3 IMPLANT
NEEDLE INSUFFLATION 14GA 120MM (NEEDLE) IMPLANT
NS IRRIG 1000ML POUR BTL (IV SOLUTION) ×3 IMPLANT
PAD ARMBOARD 7.5X6 YLW CONV (MISCELLANEOUS) ×6 IMPLANT
PENCIL BUTTON HOLSTER BLD 10FT (ELECTRODE) ×3 IMPLANT
POUCH SPECIMEN RETRIEVAL 10MM (ENDOMECHANICALS) ×3 IMPLANT
RELOAD 45 VASCULAR/THIN (ENDOMECHANICALS) IMPLANT
RELOAD STAPLE TA45 3.5 REG BLU (ENDOMECHANICALS) ×3 IMPLANT
SCISSORS LAP 5X35 DISP (ENDOMECHANICALS) IMPLANT
SET IRRIG TUBING LAPAROSCOPIC (IRRIGATION / IRRIGATOR) ×3 IMPLANT
SET TUBE SMOKE EVAC HIGH FLOW (TUBING) ×3 IMPLANT
SHEARS HARMONIC ACE PLUS 36CM (ENDOMECHANICALS) ×3 IMPLANT
SLEEVE ENDOPATH XCEL 5M (ENDOMECHANICALS) ×3 IMPLANT
SPECIMEN JAR SMALL (MISCELLANEOUS) ×3 IMPLANT
SUT MNCRL AB 4-0 PS2 18 (SUTURE) ×3 IMPLANT
SUT VICRYL 0 UR6 27IN ABS (SUTURE) IMPLANT
TOWEL GREEN STERILE (TOWEL DISPOSABLE) ×3 IMPLANT
TOWEL GREEN STERILE FF (TOWEL DISPOSABLE) ×3 IMPLANT
TRAY LAPAROSCOPIC MC (CUSTOM PROCEDURE TRAY) ×3 IMPLANT
TROCAR XCEL BLUNT TIP 100MML (ENDOMECHANICALS) IMPLANT
TROCAR XCEL NON-BLD 5MMX100MML (ENDOMECHANICALS) ×3 IMPLANT
WATER STERILE IRR 1000ML POUR (IV SOLUTION) ×3 IMPLANT

## 2020-08-13 NOTE — Transfer of Care (Signed)
Immediate Anesthesia Transfer of Care Note  Patient: Robert Kent  Procedure(s) Performed: APPENDECTOMY LAPAROSCOPIC (N/A Abdomen)  Patient Location: PACU  Anesthesia Type:General  Level of Consciousness: drowsy, patient cooperative and responds to stimulation  Airway & Oxygen Therapy: Patient Spontanous Breathing and Patient connected to nasal cannula oxygen  Post-op Assessment: Report given to RN and Post -op Vital signs reviewed and stable  Post vital signs: Reviewed and stable  Last Vitals:  Vitals Value Taken Time  BP 102/68 08/13/20 1007  Temp    Pulse 72 08/13/20 1009  Resp 21 08/13/20 1009  SpO2 100 % 08/13/20 1009  Vitals shown include unvalidated device data.  Last Pain:  Vitals:   08/13/20 0557  TempSrc: Oral  PainSc:          Complications: No complications documented.

## 2020-08-13 NOTE — Op Note (Signed)
APPENDECTOMY LAPAROSCOPIC  Procedure Note  Robert Kent 08/13/2020   Pre-op Diagnosis: ACUTE APPENDICITIS     Post-op Diagnosis: perforated appendcitis  Procedure(s): APPENDECTOMY LAPAROSCOPIC  Surgeon(s): Abigail Miyamoto, MD  Anesthesia: General  Staff:  Circulator: Hermelinda Dellen, RN Scrub Person: Jeronimo Greaves, RN  Estimated Blood Loss: Minimal               Specimens: sent to path  Findings: The patient was found to have a perforated appendix with a phlegmon of the appendix and omentum stuck to the anterior abdominal wall.  There was an abscess which was contained in the phlegmon.  Procedure: The patient was brought to operating identifies correct patient.  He was placed on the operating table general anesthesia was induced.  His abdomen was then prepped and draped in usual sterile fashion.  I made a small vertical incision below the umbilicus with a scalpel.  I carried this down to the fascia which was then open with a scalpel.  Hemostat was used to pass to the peritoneal cavity under direct vision.  A 0 Vicryl pursing suture was then placed around the fascial opening.  The [facility] port was placed through the opening and insufflation of the abdomen was begun.  I placed a 5 mm trocar in the patient's right upper quadrant and another in the left lower quadrant both under direct vision.  The appendix with a phlegmon of the omentum was stuck to the anterior abdominal wall.  An abscess cavity was entered as I peeled it off of the abdominal wall bluntly.  I then took the omentum off of the appendix with the harmonic scalpel.  The small bowel was slightly tethered at the terminal ileum to the appendix as well and is able to bluntly dissect this free.  I was then able to dissect through the mesoappendix with the harmonic scalpel.  I then was able to identify the base of the appendix and transected with the laparoscopic GIA stapler.  The appendix was then placed in an Endosac and  removed through the incision at the umbilicus.  I evaluated the staple line which appeared to be intact.  Hemostasis also appeared to be achieved.  At this point I irrigated the abdomen and right lower quadrant with a liter of normal saline.  Hemostasis appeared to be achieved.  All ports were then removed under direct vision the abdomen was deflated.  The 0 Vicryl at the umbilicus was tied in place closing the fascial defect.  All incisions were then anesthetized with Marcaine and closed with 4-0 Monocryl sutures.  Dermabond was then applied.  Patient tolerated procedure well.  All the counts were correct at the end of the procedure.  The patient was then extubated in the operating room and taken in a stable condition to recovery.          Abigail Miyamoto   Date: 08/13/2020  Time: 9:57 AM

## 2020-08-13 NOTE — Anesthesia Preprocedure Evaluation (Signed)
Anesthesia Evaluation  Patient identified by MRN, date of birth, ID band Patient awake    Reviewed: Allergy & Precautions, NPO status , Patient's Chart, lab work & pertinent test results  History of Anesthesia Complications Negative for: history of anesthetic complications  Airway Mallampati: II  TM Distance: >3 FB Neck ROM: Full    Dental no notable dental hx. (+) Teeth Intact, Dental Advisory Given   Pulmonary neg pulmonary ROS, Current Smoker,    Pulmonary exam normal        Cardiovascular negative cardio ROS Normal cardiovascular exam     Neuro/Psych negative neurological ROS  negative psych ROS   GI/Hepatic negative GI ROS, Neg liver ROS,   Endo/Other  negative endocrine ROS  Renal/GU negative Renal ROS  negative genitourinary   Musculoskeletal negative musculoskeletal ROS (+)   Abdominal   Peds  Hematology negative hematology ROS (+)   Anesthesia Other Findings   Reproductive/Obstetrics                             Anesthesia Physical  Anesthesia Plan  ASA: II  Anesthesia Plan: General   Post-op Pain Management:    Induction: Intravenous  PONV Risk Score and Plan: 1  Airway Management Planned: Oral ETT  Additional Equipment: None  Intra-op Plan:   Post-operative Plan: Extubation in OR  Informed Consent: I have reviewed the patients History and Physical, chart, labs and discussed the procedure including the risks, benefits and alternatives for the proposed anesthesia with the patient or authorized representative who has indicated his/her understanding and acceptance.       Plan Discussed with: Anesthesiologist and CRNA  Anesthesia Plan Comments: (  )        Anesthesia Quick Evaluation

## 2020-08-13 NOTE — Anesthesia Postprocedure Evaluation (Signed)
Anesthesia Post Note  Patient: Robert Kent  Procedure(s) Performed: APPENDECTOMY LAPAROSCOPIC (N/A Abdomen)     Patient location during evaluation: PACU Anesthesia Type: General Level of consciousness: awake and alert Pain management: pain level controlled Vital Signs Assessment: post-procedure vital signs reviewed and stable Respiratory status: spontaneous breathing, nonlabored ventilation, respiratory function stable and patient connected to nasal cannula oxygen Cardiovascular status: blood pressure returned to baseline and stable Postop Assessment: no apparent nausea or vomiting Anesthetic complications: no   No complications documented.  Last Vitals:  Vitals:   08/13/20 1118 08/13/20 1130  BP: 101/62 105/61  Pulse: 70 64  Resp: 17 17  Temp: 36.7 C 36.8 C  SpO2: 96% 96%    Last Pain:  Vitals:   08/13/20 1130  TempSrc: Axillary  PainSc:                  Lucila Klecka

## 2020-08-13 NOTE — Progress Notes (Signed)
Patient ID: Robert Kent, male   DOB: 19-Jul-1986, 34 y.o.   MRN: [id number]   I am free now so will be doing the appendectomy  Pre Procedure note for inpatients:   Robert Kent has been scheduled for Procedure(s): APPENDECTOMY LAPAROSCOPIC (N/A) today. The various methods of treatment have been discussed with the patient. After consideration of the risks, benefits and treatment options the patient has consented to the planned procedure.   The patient has been seen and labs reviewed. There are no changes in the patient's condition to prevent proceeding with the planned procedure today.  Recent labs:  Lab Results  Component Value Date   WBC 8.6 08/13/2020   HGB 11.7 (L) 08/13/2020   HCT 35.6 (L) 08/13/2020   PLT 108 (L) 08/13/2020   GLUCOSE 112 (H) 08/13/2020   ALT 15 08/12/2020   AST 18 08/12/2020   NA 137 08/13/2020   K 3.6 08/13/2020   CL 102 08/13/2020   CREATININE 1.02 08/13/2020   BUN 12 08/13/2020   CO2 26 08/13/2020    Abigail Miyamoto, MD 08/13/2020 8:45 AM

## 2020-08-13 NOTE — Anesthesia Procedure Notes (Signed)
Procedure Name: Intubation Date/Time: 08/13/2020 9:10 AM Performed by: Shary Decamp, CRNA Pre-anesthesia Checklist: Patient identified, Patient being monitored, Timeout performed, Emergency Drugs available and Suction available Patient Re-evaluated:Patient Re-evaluated prior to induction Oxygen Delivery Method: Circle System Utilized Preoxygenation: Pre-oxygenation with 100% oxygen Induction Type: IV induction Ventilation: Mask ventilation without difficulty Laryngoscope Size: Miller and 3 Grade View: Grade I Tube type: Oral Tube size: 7.5 mm Number of attempts: 1 Airway Equipment and Method: Stylet Placement Confirmation: ETT inserted through vocal cords under direct vision,  positive ETCO2 and breath sounds checked- equal and bilateral Secured at: 21 cm Tube secured with: Tape Dental Injury: Teeth and Oropharynx as per pre-operative assessment

## 2020-08-13 NOTE — Telephone Encounter (Signed)
Noted. Lap Appy started at 0930 this morning. Will plan to follow up with pt after the weekend on Monday.

## 2020-08-13 NOTE — Progress Notes (Signed)
General Surgery Follow Up Note  Subjective:    Overnight Issues:   Objective:  Vital signs for last 24 hours: Temp:  [97.8 F (36.6 C)-100.2 F (37.9 C)] 99.2 F (37.3 C) (11/12 0557) Pulse Rate:  [56-85] 84 (11/12 0557) Resp:  [10-22] 18 (11/12 0557) BP: (93-117)/(45-73) 94/55 (11/12 0557) SpO2:  [96 %-100 %] 96 % (11/12 0557) Weight:  [63.5 kg] 63.5 kg (11/11 1459)  Hemodynamic parameters for last 24 hours:    Intake/Output from previous day: 11/11 0701 - 11/12 0700 In: 1000 [IV Piggyback:1000] Out: -   Intake/Output this shift: No intake/output data recorded.  Vent settings for last 24 hours:    Physical Exam:  Gen: comfortable, no distress Neuro: non-focal exam HEENT: PERRL Neck: supple CV: RRR Pulm: unlabored breathing Abd: soft, RLQ TTP Extr: wwp, no edema   Results for orders placed or performed during the hospital encounter of 08/12/20 (from the past 24 hour(s))  Lipase, blood     Status: None   Collection Time: 08/12/20  3:18 PM  Result Value Ref Range   Lipase 24 11 - 51 U/L  Comprehensive metabolic panel     Status: Abnormal   Collection Time: 08/12/20  3:18 PM  Result Value Ref Range   Sodium 136 135 - 145 mmol/L   Potassium 3.9 3.5 - 5.1 mmol/L   Chloride 99 98 - 111 mmol/L   CO2 25 22 - 32 mmol/L   Glucose, Bld 124 (H) 70 - 99 mg/dL   BUN 14 6 - 20 mg/dL   Creatinine, Ser 4.09 0.61 - 1.24 mg/dL   Calcium 9.3 8.9 - 81.1 mg/dL   Total Protein 7.5 6.5 - 8.1 g/dL   Albumin 4.1 3.5 - 5.0 g/dL   AST 18 15 - 41 U/L   ALT 15 0 - 44 U/L   Alkaline Phosphatase 54 38 - 126 U/L   Total Bilirubin 1.0 0.3 - 1.2 mg/dL   GFR, Estimated >91 >47 mL/min   Anion gap 12 5 - 15  CBC     Status: Abnormal   Collection Time: 08/12/20  3:18 PM  Result Value Ref Range   WBC 10.9 (H) 4.0 - 10.5 K/uL   RBC 4.91 4.22 - 5.81 MIL/uL   Hemoglobin 12.7 (L) 13.0 - 17.0 g/dL   HCT 82.9 39 - 52 %   MCV 83.3 80.0 - 100.0 fL   MCH 25.9 (L) 26.0 - 34.0 pg   MCHC  31.1 30.0 - 36.0 g/dL   RDW 56.2 13.0 - 86.5 %   Platelets 128 (L) 150 - 400 K/uL   nRBC 0.0 0.0 - 0.2 %  Respiratory Panel by RT PCR (Flu A&B, Covid) - Nasopharyngeal Swab     Status: None   Collection Time: 08/12/20  4:31 PM   Specimen: Nasopharyngeal Swab  Result Value Ref Range   SARS Coronavirus 2 by RT PCR NEGATIVE NEGATIVE   Influenza A by PCR NEGATIVE NEGATIVE   Influenza B by PCR NEGATIVE NEGATIVE  Basic metabolic panel     Status: Abnormal   Collection Time: 08/13/20  1:48 AM  Result Value Ref Range   Sodium 137 135 - 145 mmol/L   Potassium 3.6 3.5 - 5.1 mmol/L   Chloride 102 98 - 111 mmol/L   CO2 26 22 - 32 mmol/L   Glucose, Bld 112 (H) 70 - 99 mg/dL   BUN 12 6 - 20 mg/dL   Creatinine, Ser 7.84 0.61 - 1.24 mg/dL  Calcium 8.7 (L) 8.9 - 10.3 mg/dL   GFR, Estimated >70 >96 mL/min   Anion gap 9 5 - 15  CBC     Status: Abnormal   Collection Time: 08/13/20  1:48 AM  Result Value Ref Range   WBC 8.6 4.0 - 10.5 K/uL   RBC 4.36 4.22 - 5.81 MIL/uL   Hemoglobin 11.7 (L) 13.0 - 17.0 g/dL   HCT 28.3 (L) 39 - 52 %   MCV 81.7 80.0 - 100.0 fL   MCH 26.8 26.0 - 34.0 pg   MCHC 32.9 30.0 - 36.0 g/dL   RDW 66.2 94.7 - 65.4 %   Platelets 108 (L) 150 - 400 K/uL   nRBC 0.0 0.0 - 0.2 %    Assessment & Plan: The plan of care was discussed with the bedside nurse for the day, who is in agreement with this plan and no additional concerns were raised.   Present on Admission: . Acute perforated appendicitis    LOS: 1 day   Additional comments:I reviewed the patient's new clinical lab test results.   and I reviewed the patients new imaging test results.    Acute appendicitis - lap appy today. On zosyn. Informed consent was obtained after detailed explanation of risks, including bleeding, infection, abscess, staple line leak, stump appendicitis, and need for conversion to open procedure. All questions answered to the patient's satisfaction. FEN - NPO DVT - SCDs Dispo - likely home in  AM   Diamantina Monks, MD Trauma & General Surgery Please use [email or web address] to contact on call provider  08/13/2020  *Care during the described time interval was provided by me. I have reviewed this patient's available data, including medical history, events of note, physical examination and test results as part of my evaluation.

## 2020-08-13 NOTE — Telephone Encounter (Signed)
Noted patient in OR today.

## 2020-08-14 ENCOUNTER — Encounter (HOSPITAL_COMMUNITY): Payer: Self-pay | Admitting: Surgery

## 2020-08-14 MED ORDER — AMOXICILLIN-POT CLAVULANATE 875-125 MG PO TABS: 1.0000 | ORAL_TABLET | Freq: Two times a day (BID) | ORAL | 0 refills | Status: AC

## 2020-08-14 MED ORDER — HYDROCODONE-ACETAMINOPHEN 5-325 MG PO TABS: 1.0000 | ORAL_TABLET | Freq: Four times a day (QID) | ORAL | 0 refills | Status: AC | PRN

## 2020-08-14 NOTE — Progress Notes (Signed)
Pt denies wheelchair, ambulated off unit with RN

## 2020-08-14 NOTE — Discharge Instructions (Signed)
Laparoscopic Appendectomy, Adult, Care After This sheet gives you information about how to care for yourself after your procedure. Your doctor may also give you more specific instructions. If you have problems or questions, contact your doctor. What can I expect after the procedure? After the procedure, it is common to have:  Little energy for normal activities.  Mild pain in the area where the cuts from surgery (incisions) were made.  Trouble pooping (constipation). This can be caused by: ? Pain medicine. ? A lack of activity. Follow these instructions at home: Medicines  Take over-the-counter and prescription medicines only as told by your doctor.  If you were prescribed an antibiotic medicine, take it as told by your doctor. Do not stop taking it even if you start to feel better.  Do not drive or use heavy machinery while taking prescription pain medicine.  Ask your doctor if the medicine you are taking can cause trouble pooping. You may need to take steps to prevent or treat trouble pooping: ? Drink enough fluid to keep your pee (urine) pale yellow. ? Take over-the-counter or prescription medicines. ? Eat foods that are high in fiber. These include beans, whole grains, and fresh fruits and vegetables. ? Limit foods that are high in fat and sugar. These include fried or sweet foods. Incision care   Follow instructions from your doctor about how to take care of your cuts from surgery. Make sure you: ? Wash your hands with soap and water before and after you change your bandage (dressing). If you cannot use soap and water, use hand sanitizer. ? Change your bandage as told by your doctor. ? Leave stitches (sutures), skin glue, or skin tape (adhesive) strips in place. They may need to stay in place for 2 weeks or longer. If tape strips get loose and curl up, you may trim the loose edges. Do not remove tape strips completely unless your doctor says it is okay.  Check your cuts from  surgery every day for signs of infection. Check for: ? Redness, swelling, or pain. ? Fluid or blood. ? Warmth. ? Pus or a bad smell. Bathing  Keep your cuts from surgery clean and dry. Clean them as told by your doctor. To do this: 1. Gently wash the cuts with soap and water. 2. Rinse the cuts with water to remove all soap. 3. Pat the cuts dry with a clean towel. Do not rub the cuts.  Do not take baths, swim, or use a hot tub for 2 weeks, or until your doctor says it is okay. You may take showers after 48 hours. Activity   Do not drive for 24 hours if you were given a medicine to help you relax (sedative) during your procedure.  Rest after the procedure. Return to your normal activities as told by your doctor. Ask your doctor what activities are safe for you.  For 3 weeks, or for as long as told by your doctor: ? Do not lift anything that is heavier than 10 lb (4.5 kg), or the limit that you are told. ? Do not play contact sports. General instructions  If you were sent home with a drain, follow instructions from your doctor on how to care for it.  Take deep breaths. This helps to keep your lungs from getting an infection (pneumonia).  Keep all follow-up visits as told by your doctor. This is important. Contact a doctor if:  You have redness, swelling, or pain around a cut from surgery.    You have fluid or blood coming from a cut.  Your cut feels warm to the touch.  You have pus or a bad smell coming from a cut or a bandage.  The edges of a cut break open after the stitches have been taken out.  You have pain in your shoulders that gets worse.  You feel dizzy or you pass out (faint).  You have shortness of breath.  You keep feeling sick to your stomach (nauseous).  You keep throwing up (vomiting).  You get watery poop (diarrhea) or you cannot control your poop.  You lose your appetite.  You have swelling or pain in your legs.  You get a rash. Get help right  away if:  You have a fever.  You have trouble breathing.  You have sharp pains in your chest. Summary  After the procedure, it is common to have low energy, mild pain, and trouble pooping.  Infection is a common problem after this procedure. Follow your doctor's instructions about caring for yourself after the procedure.  Rest after the procedure. Return to your normal activities as told by your doctor.  Contact your doctor if you see signs of infection around your cuts from surgery, or you get short of breath. Get help right away if you have a fever, chest pain, or trouble breathing. This information is not intended to replace advice given to you by your health care provider. Make sure you discuss any questions you have with your health care provider. Document Revised: 03/21/2018 Document Reviewed: 03/21/2018 Elsevier Patient Education  2020 [employer]  

## 2020-08-14 NOTE — Discharge Summary (Signed)
Physician Discharge Summary  Patient ID: Robert Kent MRN: [id number] DOB/AGE: 1986-02-21 34 y.o.  PCP: Patient, No Pcp Per  Admit date: 08/12/2020 Discharge date: 08/14/2020  Admission Diagnoses:  Acute appendicitis  Discharge Diagnoses:  Acute perforated contained appendicitis  Active Problems:   Acute perforated appendicitis   Surgery:  Laparoscopic appendectomy  Discharged Condition: improved  Hospital Course:   Had surgery by Dr. Magnus Ivan on Friday.  Incisions bland and ready for discharge.  Script for Augmentin sent.  Consults: none  Significant Diagnostic Studies: none    Discharge Exam: Blood pressure (!) 96/59, pulse (!) 54, temperature (!) 97.4 F (36.3 C), temperature source Oral, resp. rate 16, height 5\' 8"  (1.727 m), weight 63.5 kg, SpO2 100 %. Incisions without redness or bruising  Disposition: Discharge disposition: 01-Home or Self Care       Discharge Instructions    Call MD for:  persistant nausea and vomiting   Complete by: As directed    Call MD for:  redness, tenderness, or signs of infection (pain, swelling, redness, odor or green/yellow discharge around incision site)   Complete by: As directed    Diet - low sodium heart healthy   Complete by: As directed    Increase activity slowly   Complete by: As directed      Allergies as of 08/14/2020   No Known Allergies     Medication List    TAKE these medications   amoxicillin-clavulanate 875-125 MG tablet Commonly known as: Augmentin Take 1 tablet by mouth 2 (two) times daily for 7 days.   HYDROcodone-acetaminophen 5-325 MG tablet Commonly known as: NORCO/VICODIN Take 1 tablet by mouth every 6 (six) hours as needed for moderate pain.        Signed: 08/16/2020 08/14/2020, 12:47 PM

## 2020-08-14 NOTE — Telephone Encounter (Signed)
Reviewed Op note and laparoscopic surgery performed planning discharge 08/14/2020

## 2020-08-14 NOTE — Progress Notes (Signed)
Pt IV removed, catheter intact  Pt discharge education provided at bedside  Pt has all belongings including out of work letter  Awaiting pt discharge transportation

## 2020-08-15 ENCOUNTER — Encounter: Payer: Self-pay | Admitting: Registered Nurse

## 2020-08-15 MED ORDER — DOCUSATE SODIUM 100 MG PO CAPS: 100.0000 mg | ORAL_CAPSULE | Freq: Two times a day (BID) | ORAL | 0 refills | Status: AC

## 2020-08-15 NOTE — Telephone Encounter (Signed)
Patient contacted via telephone as noted discharged to home.  Patient reported having a little pain taking his medication as prescribed pain and antibiotic.  Noted feeling hot last night but did not take temperature.  Increased water intake, took shower and felt better.  Noticed constipated today.  Wondering what he can do.  Discussed I can send in Rx for colace 100mg  po BID x 14 days #28 RF0 electronic Rx to his pharmacy of choice.  Discussed is not a laxative but a stool softener as constipation common after abdomen surgeries and narcotic pain medication.  Denied bleeding from rectum.  Discussed if worsening abdomen pain, fever/chills, bleeding from rectum he should call his surgeon's office.  Discussed with patient to check temperature the next time he is feeling hot.  Discussed RN would contact him tomorrow via telephone for follow up.  Patient verbalized understanding information/instrudctions, agreed with plan of care and had no further questions at this time.

## 2020-08-16 LAB — SURGICAL PATHOLOGY

## 2020-08-16 NOTE — Telephone Encounter (Signed)
Spoke with pt over phone. He reported constipation but was able to pick up Colace this morning. Advised him this could take a few days to start working fully. He understands this and reports he is drinking a lot of water to try to help as well.  He does endorse pain currently at 9/10. When he went to the ED, he reported pain was 10/10. Last pain med (hydrocodone/acetaminophen) at 0200. Advised him to take pain med now and if not improved back to 2-3/10 as it was last night after taking med, to call his surgeon's office and report uncontrolled pain as it has been controlled up to this point and this severity seems to be new for him since d/c.  He reports he will do this. Also denies any more episodes of feeling hot, or with chills. Advised pt RN will f/u with him this afternoon or tomorrow AM.

## 2020-08-16 NOTE — Telephone Encounter (Signed)
Noted agree with plan of care that patient should contact surgeon office if worsening pain not controlled with prescribed medications.  Patient reported yesterday he is resting and is not exerting himself/lifting/exercising but was concerned regarding constipation.  Denied blood from rectum yesterday.

## 2020-08-17 NOTE — Telephone Encounter (Signed)
Noted agree with plan of care 

## 2020-08-17 NOTE — Telephone Encounter (Signed)
Attempted to call pt for check in. No answer. LVMRCB.

## 2020-08-17 NOTE — Telephone Encounter (Signed)
Spoke with pt. Pain 5-6/10 today. Much improved compared to yesterday. Reports po meds controlling it well now. Constipation has also improved. Advised pt to continue Colace while taking narcotic pain meds and throughout his healing process to avoid increased pain from constipation or straining. Continue pushing fluids as well. Discussed calling his surgeon's office for any f/u appt needs as nothing scheduled at this time and work note he was given at d/c has him returning to full duty 2 weeks post op. Pt does a lot of lifting and pushing. Advised to discuss work requirements with surgeon to confirm 2 week return. Pt verbalizes understanding and agreement. Denies any further needs or concerns at this time.

## 2020-08-20 NOTE — Telephone Encounter (Signed)
Noted improved pain and RN or I will follow up with patient next week.

## 2020-08-20 NOTE — Telephone Encounter (Signed)
Spoke with pt over phone. He reports pain is well controlled currently and has been over the past few days. Mild pain over incision sites. He has not spoken with the surgeon's office yet as pain has been better but sts he will next week if he doesn't feel he can RTW 11/26 as planned due to the amount of lifting/pushing/pulling he is required to do. Advised RN will contact him again before clinic closes for the Thanksgiving holiday on Tuesday afternoon. He is agreeable to this. No further questions/concerns.

## 2020-08-25 NOTE — Telephone Encounter (Signed)
Patient contacted via telephone reported eating and voiding/stooling without difficulty but having increased pain if he exerts himself.  Tried to walk more today to get ready for work Friday and he stated pain level went up and having to walk stooped over.  Denied fever/chills. Had finished augmentin and norco Rxs.  Discussed with patient he can use OTC tylenol (acetaminophen) 325mg -3 pills every 6 hours as needed for pain.  Discussed motrin/ibuprofen can sometimes upset the stomach/cause gastritis and since he had surgery on intestines probably best to avoid motrin use unless surgeon approves.  Encouraged patient to contact surgeon office if he doesn't feel ready to return to work, patient also asking if I can refill his norco and due to contract limitations I am unable to fill Rxs for controlled substances in this clinic.  Discussed with patient [employer] closed until Monday RN [place] on vacation.  I will follow up via telephone on Saturday with patient.  Patient verbalized understanding information/instructions, agreed with plan of care and had no further questions at this time.

## 2020-08-28 NOTE — Telephone Encounter (Signed)
Patient contacted by telephone to follow up as increased pain when trying to increase activity this week.  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  Eating/peeing/stooling without difficulty.  Patient reported tylenol helped yesterday and today.  Surgeon's office was closed when he tried to call them for extending work excuse so he is going to call them again Monday 29 Nov.  Discussed with patient RN Rolly Salter in clinic Monday at [phone].  He stated he would notify clinic staff on Monday if RTW excuse extended by surgeon.  Patient had no further questions or concerns at this time.

## 2020-09-03 ENCOUNTER — Encounter: Payer: Self-pay | Admitting: Registered Nurse

## 2020-09-03 NOTE — Telephone Encounter (Signed)
Patient contacted via telephone surgeon out of office and nurse scheduled him appt for 7 Dec.  Had some increased pain after walking this week.  Ran out of colace.  Was told to start miralax by surgeon office and he did and reported that no longer constipated but still having a little pain with exertion.  Denied fever/chills/rash/diarrhea/hot to touch abdomen or discharge from incision sites.  Discussed with patient I would call Sunday or RN Rolly Salter Monday 12/06 to follow up with him prior to his appt with surgeon.  Patient verbalized understanding information/instructions, agreed with plan of care and had no further questions at this time.

## 2020-09-05 NOTE — Telephone Encounter (Signed)
Telephone message left for patient RN Robert Kent will not be in the clinic tomorrow.  Calling to follow up if constipation/pain improved with continued miralax use or if would like colace Rx sent in.  He has follow up appt scheduled with surgeon this week.

## 2020-09-06 ENCOUNTER — Other Ambulatory Visit: Payer: Self-pay

## 2020-09-06 ENCOUNTER — Encounter (HOSPITAL_COMMUNITY): Payer: Self-pay | Admitting: Emergency Medicine

## 2020-09-06 ENCOUNTER — Inpatient Hospital Stay (HOSPITAL_COMMUNITY)
Admission: EM | Admit: 2020-09-06 | Discharge: 2020-09-14 | DRG: 862 | Disposition: A | Discharge status: Home / Self Care | Payer: Self-pay | Attending: General Surgery | Admitting: General Surgery

## 2020-09-06 DIAGNOSIS — T8143XA Infection following a procedure, organ and space surgical site, initial encounter: Principal | ICD-10-CM | POA: Diagnosis present

## 2020-09-06 DIAGNOSIS — Z20822 Contact with and (suspected) exposure to covid-19: Secondary | ICD-10-CM | POA: Diagnosis present

## 2020-09-06 DIAGNOSIS — Z9049 Acquired absence of other specified parts of digestive tract: Secondary | ICD-10-CM

## 2020-09-06 DIAGNOSIS — B954 Other streptococcus as the cause of diseases classified elsewhere: Secondary | ICD-10-CM | POA: Diagnosis present

## 2020-09-06 DIAGNOSIS — Z23 Encounter for immunization: Secondary | ICD-10-CM

## 2020-09-06 DIAGNOSIS — F17210 Nicotine dependence, cigarettes, uncomplicated: Secondary | ICD-10-CM | POA: Diagnosis present

## 2020-09-06 DIAGNOSIS — E871 Hypo-osmolality and hyponatremia: Secondary | ICD-10-CM | POA: Diagnosis present

## 2020-09-06 DIAGNOSIS — E878 Other disorders of electrolyte and fluid balance, not elsewhere classified: Secondary | ICD-10-CM | POA: Diagnosis present

## 2020-09-06 DIAGNOSIS — R188 Other ascites: Secondary | ICD-10-CM

## 2020-09-06 DIAGNOSIS — R1031 Right lower quadrant pain: Secondary | ICD-10-CM

## 2020-09-06 DIAGNOSIS — K651 Peritoneal abscess: Secondary | ICD-10-CM | POA: Diagnosis present

## 2020-09-06 DIAGNOSIS — Y836 Removal of other organ (partial) (total) as the cause of abnormal reaction of the patient, or of later complication, without mention of misadventure at the time of the procedure: Secondary | ICD-10-CM | POA: Diagnosis present

## 2020-09-06 LAB — COMPREHENSIVE METABOLIC PANEL
ALT: 20 U/L (ref 0–44)
AST: 19 U/L (ref 15–41)
Albumin: 3.5 g/dL (ref 3.5–5.0)
Alkaline Phosphatase: 55 U/L (ref 38–126)
Anion gap: 15 (ref 5–15)
BUN: 11 mg/dL (ref 6–20)
CO2: 24 mmol/L (ref 22–32)
Calcium: 9.1 mg/dL (ref 8.9–10.3)
Chloride: 93 mmol/L — ABNORMAL LOW (ref 98–111)
Creatinine, Ser: 0.83 mg/dL (ref 0.61–1.24)
GFR, Estimated: >60 mL/min (ref >60)
Glucose, Bld: 94 mg/dL (ref 70–99)
Potassium: 4.1 mmol/L (ref 3.5–5.1)
Sodium: 132 mmol/L — ABNORMAL LOW (ref 135–145)
Total Bilirubin: 0.6 mg/dL (ref 0.3–1.2)
Total Protein: 7.6 g/dL (ref 6.5–8.1)

## 2020-09-06 LAB — CBC
HCT: 36.4 % — ABNORMAL LOW (ref 39.0–52.0)
Hemoglobin: 11.3 g/dL — ABNORMAL LOW (ref 13.0–17.0)
MCH: 25.2 pg — ABNORMAL LOW (ref 26.0–34.0)
MCHC: 31 g/dL (ref 30.0–36.0)
MCV: 81.3 fL (ref 80.0–100.0)
Platelets: 216 10*3/uL (ref 150–400)
RBC: 4.48 MIL/uL (ref 4.22–5.81)
RDW: 12.5 % (ref 11.5–15.5)
WBC: 11.9 10*3/uL — ABNORMAL HIGH (ref 4.0–10.5)
nRBC: 0 % (ref 0.0–0.2)

## 2020-09-06 LAB — LIPASE, BLOOD: Lipase: 21 U/L (ref 11–51)

## 2020-09-06 NOTE — ED Triage Notes (Signed)
Pt states he had his appendix remove a month ago and now he is having abd pain again.

## 2020-09-06 NOTE — Telephone Encounter (Signed)
Noted patient seen in ED today still undergoing workup labs drawn results pending worsening abdomen pain per RN triage note.

## 2020-09-07 ENCOUNTER — Encounter (HOSPITAL_COMMUNITY): Payer: Self-pay

## 2020-09-07 ENCOUNTER — Emergency Department (HOSPITAL_COMMUNITY): Payer: Self-pay

## 2020-09-07 DIAGNOSIS — R188 Other ascites: Secondary | ICD-10-CM | POA: Diagnosis present

## 2020-09-07 LAB — URINALYSIS, ROUTINE W REFLEX MICROSCOPIC
Bilirubin Urine: NEGATIVE
Glucose, UA: NEGATIVE mg/dL
Hgb urine dipstick: NEGATIVE
Ketones, ur: 20 mg/dL — AB
Leukocytes,Ua: NEGATIVE
Nitrite: NEGATIVE
Protein, ur: NEGATIVE mg/dL
Specific Gravity, Urine: 1.028 (ref 1.005–1.030)
pH: 5 (ref 5.0–8.0)

## 2020-09-07 LAB — RESP PANEL BY RT-PCR (FLU A&B, COVID) ARPGX2
Influenza A by PCR: NEGATIVE
Influenza B by PCR: NEGATIVE
SARS Coronavirus 2 by RT PCR: NEGATIVE

## 2020-09-07 MED ORDER — LACTATED RINGERS IV BOLUS
1000.0000 mL | Freq: Once | INTRAVENOUS | Status: AC
  Administered 2020-09-07: 1000 mL via INTRAVENOUS

## 2020-09-07 MED ORDER — METHOCARBAMOL 1000 MG/10ML IJ SOLN
500.0000 mg | Freq: Four times a day (QID) | INTRAVENOUS | Status: DC | PRN
  Filled 2020-09-07 (×4): qty 5

## 2020-09-07 MED ORDER — KCL IN DEXTROSE-NACL 20-5-0.45 MEQ/L-%-% IV SOLN
INTRAVENOUS | Status: DC
  Filled 2020-09-07 (×14): qty 1000

## 2020-09-07 MED ORDER — METOPROLOL TARTRATE 5 MG/5ML IV SOLN: 5.0000 mg | Freq: Four times a day (QID) | INTRAVENOUS | Status: DC | PRN

## 2020-09-07 MED ORDER — MORPHINE SULFATE (PF) 2 MG/ML IV SOLN
1.0000 mg | INTRAVENOUS | Status: DC | PRN
  Administered 2020-09-07: 4 mg via INTRAVENOUS
  Filled 2020-09-07: qty 2

## 2020-09-07 MED ORDER — ONDANSETRON HCL 4 MG/2ML IJ SOLN: 4.0000 mg | Freq: Four times a day (QID) | INTRAMUSCULAR | Status: DC | PRN

## 2020-09-07 MED ORDER — PIPERACILLIN-TAZOBACTAM 3.375 G IVPB
3.3750 g | Freq: Three times a day (TID) | INTRAVENOUS | Status: DC
  Administered 2020-09-07 – 2020-09-12 (×15): 3.375 g via INTRAVENOUS
  Filled 2020-09-07 (×14): qty 50

## 2020-09-07 MED ORDER — OXYCODONE HCL 5 MG PO TABS
5.0000 mg | ORAL_TABLET | ORAL | Status: DC | PRN
  Administered 2020-09-07 – 2020-09-09 (×6): 10 mg via ORAL
  Filled 2020-09-07 (×6): qty 2

## 2020-09-07 MED ORDER — MORPHINE SULFATE (PF) 4 MG/ML IV SOLN
4.0000 mg | Freq: Once | INTRAVENOUS | Status: DC
  Administered 2020-09-07: 4 mg via INTRAVENOUS
  Filled 2020-09-07: qty 1

## 2020-09-07 MED ORDER — FENTANYL CITRATE (PF) 100 MCG/2ML IJ SOLN: 50.0000 ug | INTRAMUSCULAR | Status: DC | PRN

## 2020-09-07 MED ORDER — SODIUM CHLORIDE 0.9 % IV BOLUS
1000.0000 mL | Freq: Once | INTRAVENOUS | Status: AC
  Administered 2020-09-07: 1000 mL via INTRAVENOUS

## 2020-09-07 MED ORDER — ACETAMINOPHEN 325 MG PO TABS: 650.0000 mg | ORAL_TABLET | ORAL | Status: DC | PRN

## 2020-09-07 MED ORDER — DIPHENHYDRAMINE HCL 50 MG/ML IJ SOLN: 25.0000 mg | Freq: Four times a day (QID) | INTRAMUSCULAR | Status: DC | PRN

## 2020-09-07 MED ORDER — ENOXAPARIN SODIUM 40 MG/0.4ML ~~LOC~~ SOLN
40.0000 mg | SUBCUTANEOUS | Status: DC
  Administered 2020-09-09 – 2020-09-13 (×5): 40 mg via SUBCUTANEOUS
  Filled 2020-09-07 (×6): qty 0.4

## 2020-09-07 MED ORDER — DIPHENHYDRAMINE HCL 25 MG PO CAPS: 25.0000 mg | ORAL_CAPSULE | Freq: Four times a day (QID) | ORAL | Status: DC | PRN

## 2020-09-07 MED ORDER — ONDANSETRON 4 MG PO TBDP: 4.0000 mg | ORAL_TABLET | Freq: Four times a day (QID) | ORAL | Status: DC | PRN

## 2020-09-07 MED ORDER — SIMETHICONE 80 MG PO CHEW: 40.0000 mg | CHEWABLE_TABLET | Freq: Four times a day (QID) | ORAL | Status: DC | PRN

## 2020-09-07 MED ORDER — IOHEXOL 300 MG/ML  SOLN
100.0000 mL | Freq: Once | INTRAMUSCULAR | Status: AC | PRN
  Administered 2020-09-07: 100 mL via INTRAVENOUS

## 2020-09-07 NOTE — H&P (Addendum)
Robert Kent Art 21-Nov-1985  [id number].    Chief Complaint/Reason for Consult: intra-abdominal abscess, s/p lap appy for perf appy  HPI:  This is a 34 yo male who underwent a lap appy by Dr. Magnus Ivan on 08/13/20 for perforated appendicitis with phlegmon.  He tolerated the procedure well and was discharged home the following day on oral augmentin.  He initially did well at home, but had issues with constipation.  He then started having pain about 3-4 days ago in the RUQ and RLQ of his abdomen.  He has some pelvic pain with urination. Also reports early satiety/decreased appetite. States his last BM was this am after taking miralax.  He denies fevers, chills, CP, SOB.  Because his pain continued to worsen, he presented to the ED today where he was found to have a couple of intra-abdominal fluid collections c/w abscesses.  His WBC is mildly elevated 11.9.  We have been asked to see him for admission.  ROS: ROS: Please see HPI, otherwise all other systems have been reviewed and are negative  Family History  Family history unknown: Yes    Past Medical History:  Diagnosis Date  . Subperiosteal abscess of jaw 08/07/2018    Past Surgical History:  Procedure Laterality Date  . INCISION AND DRAINAGE ABSCESS N/A 08/09/2018   Procedure: INCISION AND DRAINAGE ABSCESS;  Surgeon: [name], DDS;  Location: [facility] OR;  Service: Oral Surgery;  Laterality: N/A;  . LAPAROSCOPIC APPENDECTOMY N/A 08/13/2020   Procedure: APPENDECTOMY LAPAROSCOPIC;  Surgeon: Abigail Miyamoto, MD;  Location[facility] OR;  Service: General;  Laterality: N/A;  . NO PAST SURGERIES      Social History:  reports that he has been smoking cigarettes. He has a 2.50 pack-year smoking history. He has never used smokeless tobacco. He reports current alcohol use. He reports that he does not use drugs.  Allergies: No Known Allergies  (Not in a hospital admission)    Physical Exam: Blood pressure 99/63, pulse 68, temperature 98.4  F (36.9 C), temperature source Oral, resp. rate 15, height 5\' 8"  (1.727 m), weight 63.5 kg, SpO2 100 %. General: pleasant, WD, WN male who is laying in bed in NAD HEENT: head is normocephalic, atraumatic.  Sclera are noninjected.  PERRL.  Ears and nose without any masses or lesions.  Mouth is pink and moist Heart: regular, rate, and rhythm.  Normal s1,s2. No obvious murmurs, gallops, or rubs noted.  Palpable radial and pedal pulses bilaterally Lungs: CTAB, no wheezes, rhonchi, or rales noted.  Respiratory effort nonlabored Abd: soft, tender to light palpation of RUQ, RLQ, and supra-pubic region, +BS, incisions c/d/i without cellulitis, no masses, hernias, or organomegaly MS: all 4 extremities are symmetrical with no cyanosis, clubbing, or edema. Skin: warm and dry with no masses, lesions, or rashes Neuro: Cranial nerves 2-12 grossly intact, sensation is normal throughout Psych: A&Ox3 with an appropriate affect.   Results for orders placed or performed during the hospital encounter of 09/06/20 (from the past 48 hour(s))  Urinalysis, Routine w reflex microscopic     Status: Abnormal   Collection Time: 09/06/20 12:00 PM  Result Value Ref Range   Color, Urine AMBER (A) YELLOW    Comment: BIOCHEMICALS MAY BE AFFECTED BY COLOR   APPearance CLEAR CLEAR   Specific Gravity, Urine 1.028 1.005 - 1.030   pH 5.0 5.0 - 8.0   Glucose, UA NEGATIVE NEGATIVE mg/dL   Hgb urine dipstick NEGATIVE NEGATIVE   Bilirubin Urine NEGATIVE NEGATIVE  Ketones, ur 20 (A) NEGATIVE mg/dL   Protein, ur NEGATIVE NEGATIVE mg/dL   Nitrite NEGATIVE NEGATIVE   Leukocytes,Ua NEGATIVE NEGATIVE    Comment: Performed at [facility], [address], [place], [place] [address]  Lipase, blood     Status: None   Collection Time: 09/06/20  4:00 PM  Result Value Ref Range   Lipase 21 11 - 51 U/L    Comment: Performed at [facility], [address], [place], [place] [address]  Comprehensive metabolic panel      Status: Abnormal   Collection Time: 09/06/20  4:00 PM  Result Value Ref Range   Sodium 132 (L) 135 - 145 mmol/L   Potassium 4.1 3.5 - 5.1 mmol/L   Chloride 93 (L) 98 - 111 mmol/L   CO2 24 22 - 32 mmol/L   Glucose, Bld 94 70 - 99 mg/dL    Comment: Glucose reference range applies only to samples taken after fasting for at least 8 hours.   BUN 11 6 - 20 mg/dL   Creatinine, Ser 1.74 0.61 - 1.24 mg/dL   Calcium 9.1 8.9 - 94.4 mg/dL   Total Protein 7.6 6.5 - 8.1 g/dL   Albumin 3.5 3.5 - 5.0 g/dL   AST 19 15 - 41 U/L   ALT 20 0 - 44 U/L   Alkaline Phosphatase 55 38 - 126 U/L   Total Bilirubin 0.6 0.3 - 1.2 mg/dL   GFR, Estimated >96 >75 mL/min    Comment: (NOTE) Calculated using the CKD-EPI Creatinine Equation (2021)    Anion gap 15 5 - 15    Comment: Performed at [facility], [address] [place], [place] [address]  CBC     Status: Abnormal   Collection Time: 09/06/20  4:00 PM  Result Value Ref Range   WBC 11.9 (H) 4.0 - 10.5 K/uL   RBC 4.48 4.22 - 5.81 MIL/uL   Hemoglobin 11.3 (L) 13.0 - 17.0 g/dL   HCT 46.6 (L) 39 - 52 %   MCV 81.3 80.0 - 100.0 fL   MCH 25.2 (L) 26.0 - 34.0 pg   MCHC 31.0 30.0 - 36.0 g/dL   RDW 59.9 35.7 - 01.7 %   Platelets 216 150 - 400 K/uL    Comment: REPEATED TO VERIFY   nRBC 0.0 0.0 - 0.2 %    Comment: Performed at [facility], [address], [place], [place] [address]   CT ABDOMEN PELVIS W CONTRAST  Result Date: 09/07/2020 CLINICAL DATA:  34 year old with history of laparoscopic appendectomy on 08/13/2020. Patient presents with right lower quadrant pain. EXAM: CT ABDOMEN AND PELVIS WITH CONTRAST TECHNIQUE: Multidetector CT imaging of the abdomen and pelvis was performed using the standard protocol following bolus administration of intravenous contrast. CONTRAST:  OMNIPAQUE IOHEXOL 300 MG/ML  SOLN COMPARISON:  CT abdomen pelvis 08/12/2020 FINDINGS: Lower chest: Lung bases are clear.  No pleural effusions. Hepatobiliary: Normal  appearance of the gallbladder without distension. Stable tiny low-density focus in left hepatic lobe may be related to the biliary system. Hepatic veins are patent. Main portal venous system is patent. Soft tissue thickening or complex fluid along the anterior and inferior aspect of the liver on sequence 3, image 37. This thickening measures roughly 7 mm in thickness. Pancreas: Unremarkable. No pancreatic ductal dilatation or surrounding inflammatory changes. Spleen: Normal in size without focal abnormality. Adrenals/Urinary Tract: Normal appearance of the adrenal glands. Normal appearance of both kidneys without hydronephrosis.  Small amount of fluid in the urinary bladder. Mild urinary bladder wall thickening is nonspecific. Stomach/Bowel: Wall thickening involving the rectum, best seen on sequence 3, image 75. Complex peripherally enhancing fluid collection is situated between the sigmoid colon and the rectum series 3, image 71. Mild wall thickening involving the sigmoid colon. Changes compatible with an appendectomy at the cecum. Negative for bowel obstruction. Normal appearance of stomach. Vascular/Lymphatic: Vascular structures are unremarkable. No significant lymph node enlargement in the abdomen or pelvis. Slightly prominent lymph node between the aorta and inferior vena cava on sequence 6 image 43 measures up to 1.2 cm, previously measured 1.1 cm. Overall, no significant lymph node enlargement. Reproductive: Prostate is unremarkable. Other: There is a complex small fluid collection with probable peripheral enhancement situated anterior to the rectum and posterior to the sigmoid colon on sequence 3, image 72. This is a compatible with an abscess that measures roughly 3.3 x 1.6 x 3.8 cm. There is adjacent wall thickening in the rectum and sigmoid colon. Irregular low-density collection with peripheral enhancement in the right upper abdomen along the inferior aspect of the liver. This collection measures 4.2 x  3.0 x 4.1 cm and compatible with an abscess. There is adjacent loculated fluid or soft tissue thickening just anterior to this collection and adjacent to the liver. Fluid filled structures in the right lower abdomen on sequence 3, image 63 probably represent small bowel loops with some wall thickening in this area. Difficult to exclude in a small interloop abscess in this area. Small amount of presacral edema. Musculoskeletal: No acute bone abnormality. IMPRESSION: 1. Inflammatory changes in the pelvis and right side of the abdomen with abscess collections. Irregular abscess in the anterior right abdomen that measures up to 4.2 cm. Surrounding inflammatory changes in the right anterior abdomen and along the inferior aspect of the liver. Second abscess in the pelvis anterior to the rectum, measures up to 3.8 cm. 2. Inflammatory changes in the right lower quadrant associated with small bowel. Difficult to exclude an interloop abscess in the right lower quadrant. 3. Wall thickening involving the rectum and sigmoid colon is likely related to the adjacent abscess. Electronically Signed   By: Richarda Overlie M.D.   On: 09/07/2020 12:44      Assessment/Plan S/p lap appy for perforated appendicitis on 11/12  Intra-abdominal abscesses Patient is hemodynamically stable and non-toxic appearing. We will plan to get this patient admitted for IV abx and IR consult for perc drain placement.  PT/INR ordered.  He will be started on zosyn.  This is something that hopefully will improve with non-operative management.    FEN - CLD, NPO after MN for IR drainage of intra-abdominal abscesses tomorrow  VTE - lovenox to start 12/8 ID - [email or web address] Admit - inpatient   Hosie Spangle, [facility] Surgery 09/07/2020, 2:11 PM Please see Amion for pager number during day hours 7:00am-4:30pm or 7:00am -11:30am on weekends

## 2020-09-07 NOTE — Consult Note (Signed)
Chief Complaint: Patient was seen in consultation today for intra-abdominal fluid collection/aspiration and drainage.  Referring Physician(s): Barnetta Chapel ([facility])  Supervising Physician: Marliss Coots  Patient Status: [facility] - ED  History of Present Illness: Robert Kent is a 34 y.o. male with a past medical history of tobacco abuse. Of note, he recently was admitted to [facility] 08/12/2020 to 08/14/2020 for management of perforated appendicitis. He underwent lap appendectomy in OR 08/13/2020 by Dr. Magnus Ivan. He presented to [facility] ED 09/06/2020 with complaints of abdominal pain. In ED, CT abdomen/pelvis concerning for intra-abdominal fluid collections. He was admitted for further management. [facility] was consulted who recommended IR consult for possible drain placement.  CT abdomen/pelvis 09/07/2020: 1. Inflammatory changes in the pelvis and right side of the abdomen with abscess collections. Irregular abscess in the anterior right abdomen that measures up to 4.2 cm. Surrounding inflammatory changes in the right anterior abdomen and along the inferior aspect of the liver. Second abscess in the pelvis anterior to the rectum, measures up to 3.8 cm. 2. Inflammatory changes in the right lower quadrant associated with small bowel. Difficult to exclude an interloop abscess in the right lower quadrant. 3. Wall thickening involving the rectum and sigmoid colon is likely related to the adjacent abscess.  IR consulted by Barnetta Chapel, PA-C for possible image-guided intra-abdominal fluid collection aspiration with possible drain placement. Patient awake and alert laying in bed. Complains of abdominal pain, rated "more than 10"/10 at this time. Denies N/V associated with abdominal pain. Denies fever, chills, chest pain, dyspnea, or headache.   Past Medical History:  Diagnosis Date  . Subperiosteal abscess of jaw 08/07/2018    Past Surgical History:  Procedure Laterality Date  . INCISION AND DRAINAGE  ABSCESS N/A 08/09/2018   Procedure: INCISION AND DRAINAGE ABSCESS;  Surgeon: [name], DDS;  Location: [facility] OR;  Service: Oral Surgery;  Laterality: N/A;  . LAPAROSCOPIC APPENDECTOMY N/A 08/13/2020   Procedure: APPENDECTOMY LAPAROSCOPIC;  Surgeon: Abigail Miyamoto, MD;  Location: [facility] OR;  Service: General;  Laterality: N/A;  . NO PAST SURGERIES      Allergies: Patient has no known allergies.  Medications: Prior to Admission medications   Medication Sig Start Date End Date Taking? Authorizing Provider  acetaminophen (TYLENOL) 325 MG tablet Take 325 mg by mouth every 6 (six) hours as needed for mild pain or headache.   Yes [provider]  polyethylene glycol (MIRALAX / GLYCOLAX) 17 g packet Take 17 g by mouth daily as needed for mild constipation.   Yes [provider]  HYDROcodone-acetaminophen (NORCO/VICODIN) 5-325 MG tablet Take 1 tablet by mouth every 6 (six) hours as needed for moderate pain. Patient not taking: Reported on 09/07/2020 08/14/20   Luretha Murphy, MD     Family History  Family history unknown: Yes    Social History   Socioeconomic History  . Marital status: Single    Spouse name: Not on file  . Number of children: Not on file  . Years of education: Not on file  . Highest education level: Not on file  Occupational History  . Occupation: shipping  Tobacco Use  . Smoking status: Current Every Day Smoker    Packs/day: 0.25    Years: 10.00    Pack years: 2.50    Types: Cigarettes  . Smokeless tobacco: Never Used  Vaping Use  . Vaping Use: Never used  Substance and Sexual Activity  . Alcohol use: Yes    Comment: no daily drinking, drinks a few  times a month  . Drug use: No  . Sexual activity: Not on file  Other Topics Concern  . Not on file  Social History Narrative  . Not on file   Social Determinants of Health   Financial Resource Strain:   . Difficulty of Paying Living Expenses: Not on file  Food Insecurity:   . Worried About  Programme researcher, broadcasting/film/video in the Last Year: Not on file  . Ran Out of Food in the Last Year: Not on file  Transportation Needs:   . Lack of Transportation (Medical): Not on file  . Lack of Transportation (Non-Medical): Not on file  Physical Activity:   . Days of Exercise per Week: Not on file  . Minutes of Exercise per Session: Not on file  Stress:   . Feeling of Stress : Not on file  Social Connections:   . Frequency of Communication with Friends and Family: Not on file  . Frequency of Social Gatherings with Friends and Family: Not on file  . Attends Religious Services: Not on file  . Active Member of Clubs or Organizations: Not on file  . Attends Banker Meetings: Not on file  . Marital Status: Not on file     Review of Systems: A 12 point ROS discussed and pertinent positives are indicated in the HPI above.  All other systems are negative.  Review of Systems  Constitutional: Negative for chills and fever.  Respiratory: Negative for shortness of breath and wheezing.   Cardiovascular: Negative for chest pain and palpitations.  Gastrointestinal: Positive for abdominal pain. Negative for nausea and vomiting.  Neurological: Negative for headaches.  Psychiatric/Behavioral: Negative for behavioral problems and confusion.    Vital Signs: BP 102/63   Pulse 84   Temp 98.4 F (36.9 C) (Oral)   Resp 19   Ht 5\' 8"  (1.727 m)   Wt 139 lb 15.9 oz (63.5 kg)   SpO2 99%   BMI 21.29 kg/m   Physical Exam Vitals and nursing note reviewed.  Constitutional:      General: He is not in acute distress.    Appearance: Normal appearance.  Cardiovascular:     Rate and Rhythm: Normal rate and regular rhythm.     Heart sounds: Normal heart sounds. No murmur heard.   Pulmonary:     Effort: Pulmonary effort is normal. No respiratory distress.     Breath sounds: Normal breath sounds. No wheezing.  Abdominal:     Palpations: Abdomen is soft.     Tenderness: There is abdominal  tenderness.  Skin:    General: Skin is warm and dry.  Neurological:     Mental Status: He is alert and oriented to person, place, and time.      MD Evaluation Airway: WNL Heart: WNL Abdomen: WNL Chest/ Lungs: WNL ASA  Classification: 3 Mallampati/Airway Score: Two   Imaging: CT ABDOMEN PELVIS W CONTRAST  Result Date: 09/07/2020 CLINICAL DATA:  34 year old with history of laparoscopic appendectomy on 08/13/2020. Patient presents with right lower quadrant pain. EXAM: CT ABDOMEN AND PELVIS WITH CONTRAST TECHNIQUE: Multidetector CT imaging of the abdomen and pelvis was performed using the standard protocol following bolus administration of intravenous contrast. CONTRAST:  13/09/2020 OMNIPAQUE IOHEXOL 300 MG/ML  SOLN COMPARISON:  CT abdomen pelvis 08/12/2020 FINDINGS: Lower chest: Lung bases are clear.  No pleural effusions. Hepatobiliary: Normal appearance of the gallbladder without distension. Stable tiny low-density focus in left hepatic lobe may be related to the biliary system. Hepatic  veins are patent. Main portal venous system is patent. Soft tissue thickening or complex fluid along the anterior and inferior aspect of the liver on sequence 3, image 37. This thickening measures roughly 7 mm in thickness. Pancreas: Unremarkable. No pancreatic ductal dilatation or surrounding inflammatory changes. Spleen: Normal in size without focal abnormality. Adrenals/Urinary Tract: Normal appearance of the adrenal glands. Normal appearance of both kidneys without hydronephrosis. Small amount of fluid in the urinary bladder. Mild urinary bladder wall thickening is nonspecific. Stomach/Bowel: Wall thickening involving the rectum, best seen on sequence 3, image 75. Complex peripherally enhancing fluid collection is situated between the sigmoid colon and the rectum series 3, image 71. Mild wall thickening involving the sigmoid colon. Changes compatible with an appendectomy at the cecum. Negative for bowel  obstruction. Normal appearance of stomach. Vascular/Lymphatic: Vascular structures are unremarkable. No significant lymph node enlargement in the abdomen or pelvis. Slightly prominent lymph node between the aorta and inferior vena cava on sequence 6 image 43 measures up to 1.2 cm, previously measured 1.1 cm. Overall, no significant lymph node enlargement. Reproductive: Prostate is unremarkable. Other: There is a complex small fluid collection with probable peripheral enhancement situated anterior to the rectum and posterior to the sigmoid colon on sequence 3, image 72. This is a compatible with an abscess that measures roughly 3.3 x 1.6 x 3.8 cm. There is adjacent wall thickening in the rectum and sigmoid colon. Irregular low-density collection with peripheral enhancement in the right upper abdomen along the inferior aspect of the liver. This collection measures 4.2 x 3.0 x 4.1 cm and compatible with an abscess. There is adjacent loculated fluid or soft tissue thickening just anterior to this collection and adjacent to the liver. Fluid filled structures in the right lower abdomen on sequence 3, image 63 probably represent small bowel loops with some wall thickening in this area. Difficult to exclude in a small interloop abscess in this area. Small amount of presacral edema. Musculoskeletal: No acute bone abnormality. IMPRESSION: 1. Inflammatory changes in the pelvis and right side of the abdomen with abscess collections. Irregular abscess in the anterior right abdomen that measures up to 4.2 cm. Surrounding inflammatory changes in the right anterior abdomen and along the inferior aspect of the liver. Second abscess in the pelvis anterior to the rectum, measures up to 3.8 cm. 2. Inflammatory changes in the right lower quadrant associated with small bowel. Difficult to exclude an interloop abscess in the right lower quadrant. 3. Wall thickening involving the rectum and sigmoid colon is likely related to the adjacent  abscess. Electronically Signed   By: Richarda Overlie M.D.   On: 09/07/2020 12:44   CT Abdomen Pelvis W Contrast  Result Date: 08/12/2020 CLINICAL DATA:  Right lower quadrant pain EXAM: CT ABDOMEN AND PELVIS WITH CONTRAST TECHNIQUE: Multidetector CT imaging of the abdomen and pelvis was performed using the standard protocol following bolus administration of intravenous contrast. CONTRAST:  71mL OMNIPAQUE IOHEXOL 350 MG/ML SOLN COMPARISON:  None. FINDINGS: Lower chest: No acute pleural or parenchymal lung disease. Hepatobiliary: No focal liver abnormality is seen. No gallstones, gallbladder wall thickening, or biliary dilatation. Pancreas: Unremarkable. No pancreatic ductal dilatation or surrounding inflammatory changes. Spleen: Normal in size without focal abnormality. Adrenals/Urinary Tract: Adrenal glands are unremarkable. Kidneys are normal, without renal calculi, focal lesion, or hydronephrosis. Duplicated left ureter incidentally noted. Bladder is unremarkable. Stomach/Bowel: Inflamed appendix is seen within the right lower quadrant, measuring up to 2.3 cm in diameter. There is marked appendiceal wall thickening,  with evidence of small perforation and focus of extraluminal gas. Multiple appendicoliths are identified. Findings are consistent with acute perforated appendicitis. No fluid collection or abscess. No bowel obstruction or ileus. Vascular/Lymphatic: No significant vascular findings. Small reactive lymph nodes are seen within the right lower quadrant mesentery. No pathologic adenopathy. Reproductive: Prostate is unremarkable. Other: Small amount of free fluid within the right lower quadrant and pelvis. No fluid collection or abscess. As above, small focus of extraluminal gas adjacent to the inflamed appendix, consistent with perforated appendicitis. No abdominal wall hernia. Musculoskeletal: No acute or destructive bony lesions. Reconstructed images demonstrate no additional findings. IMPRESSION: 1. Acute  perforated appendicitis, with small focus of extraluminal gas as above. No fluid collection or abscess. 2. Trace free fluid right lower quadrant and pelvis. Critical Value/emergent results were called by telephone at the time of interpretation on 08/12/2020 at 6:18 pm to provider Swazil[place]JORDAN ROBINSON , who verbally acknowledged these results. Electronically Signed   By: Sharlet SalinaMichael  Brown M.D.   On: 08/12/2020 18:21    Labs:  CBC: Recent Labs    08/12/20 1518 08/13/20 0148 09/06/20 1600  WBC 10.9* 8.6 11.9*  HGB 12.7* 11.7* 11.3*  HCT 40.9 35.6* 36.4*  PLT 128* 108* 216    COAGS: No results for input(s): INR, APTT in the last 8760 hours.  BMP: Recent Labs    08/12/20 1518 08/13/20 0148 09/06/20 1600  NA 136 137 132*  K 3.9 3.6 4.1  CL 99 102 93*  CO2 25 26 24   GLUCOSE 124* 112* 94  BUN 14 12 11   CALCIUM 9.3 8.7* 9.1  CREATININE 0.96 1.02 0.83  GFRNONAA >60 >60 >60    LIVER FUNCTION TESTS: Recent Labs    08/12/20 1518 09/06/20 1600  BILITOT 1.0 0.6  AST 18 19  ALT 15 20  ALKPHOS 54 55  PROT 7.5 7.6  ALBUMIN 4.1 3.5     Assessment and Plan:  History of perforated appendicitis s/p lap appendectomy in OR 08/13/2020 by Dr. Magnus IvanBlackman; complicated by development of intra-abdominal fluid collections. Plan for image-guided intra-abdominal fluid collection aspiration with possible drain placement in IR tentatively for tomorrow 09/08/2020 pending IR scheduling. Of note, case/images have been reviewed by Dr. Elby ShowersSuttle who states RUQ amendable to percutaneous drainage, however pelvic/perirectal and RLQ fluid collections not amendable to percutaneous drainage (guarded by bowel/vasculature). Patient will be NPO at midnight. Afebrile. He does not take blood thinners. INR pending.  Risks and benefits discussed with the patient including bleeding, infection, damage to adjacent structures, bowel perforation/fistula connection, and sepsis. All of the patient's questions were answered,  patient is agreeable to proceed. Consent signed and in IR control room.   Thank you for this interesting consult.  I greatly enjoyed meeting Bryne Townsend RogerKumar Winsor and look forward to participating in their care.  A copy of this report was sent to the requesting provider on this date.  Electronically Signed: Elwin MochaAlexandra Ashlin Hidalgo, PA-C 09/07/2020, 3:37 PM   I spent a total of 40 Minutes in face to face in clinical consultation, greater than 50% of which was counseling/coordinating care for intra-abdominal fluid collection/aspiration and drainage.

## 2020-09-07 NOTE — ED Provider Notes (Signed)
[facility] Provider Note   CSN: [id number] Arrival date & time: 09/06/20  1511     History Chief Complaint  Patient presents with  . Abdominal Pain    Robert Kent is a 34 y.o. male.  HPI Patient is a [age] year old male with past medical history significant for perforated appendicitis with abscess.  He had surgery 11/12 here at [facility] to have his appendix removed and had debridement of his intra-abdominal abscess at that time.  He was discharged on Augmentin and hydrocodone.  He states he had some intermittent abdominal pain in his lower abdomen for several weeks associated with some constipation.  He was placed on MiraLAX and states he has had some improvement in the symptoms however he states over the past 3 days he has had severe worsening right lower quadrant and right upper quadrant abdominal pain that he states is severe achy constant worse with movement and walking and coughing.  He states he has had no fevers has not taken any antipyretics in the past day or 2.  He states he had a soft brown BM just prior to his arrival in the ER notably he has been waiting in the emergency department for 17 hours prior to my assessment.   He denies any nausea or vomiting he denies any changes of his skin tone he denies any lightheadedness dizziness chest pain or shortness of breath.  No cough or congestion.     Past Medical History:  Diagnosis Date  . Subperiosteal abscess of jaw 08/07/2018    Patient Active Problem List   Diagnosis Date Noted  . Intra-abdominal fluid collection 09/07/2020  . Acute perforated appendicitis 08/12/2020  . Dental abscess 08/08/2018  . Acute periapical abscess 08/07/2018    Past Surgical History:  Procedure Laterality Date  . INCISION AND DRAINAGE ABSCESS N/A 08/09/2018   Procedure: INCISION AND DRAINAGE ABSCESS;  Surgeon: Ocie DoyneJensen, Scott, DDS;  Location: [facility] OR;  Service: Oral Surgery;  Laterality: N/A;  .  LAPAROSCOPIC APPENDECTOMY N/A 08/13/2020   Procedure: APPENDECTOMY LAPAROSCOPIC;  Surgeon: Abigail MiyamotoBlackman, Douglas, MD;  Location: [facility] OR;  Service: General;  Laterality: N/A;  . NO PAST SURGERIES         Family History  Family history unknown: Yes    Social History   Tobacco Use  . Smoking status: Current Every Day Smoker    Packs/day: 0.25    Years: 10.00    Pack years: 2.50    Types: Cigarettes  . Smokeless tobacco: Never Used  Vaping Use  . Vaping Use: Never used  Substance Use Topics  . Alcohol use: Yes    Comment: no daily drinking, drinks a few times a month  . Drug use: No    Home Medications Prior to Admission medications   Medication Sig Start Date End Date Taking? Authorizing Provider  acetaminophen (TYLENOL) 325 MG tablet Take 325 mg by mouth every 6 (six) hours as needed for mild pain or headache.   Yes [provider]  polyethylene glycol (MIRALAX / GLYCOLAX) 17 g packet Take 17 g by mouth daily as needed for mild constipation.   Yes [provider]  HYDROcodone-acetaminophen (NORCO/VICODIN) 5-325 MG tablet Take 1 tablet by mouth every 6 (six) hours as needed for moderate pain. Patient not taking: Reported on 09/07/2020 08/14/20   Luretha MurphyMartin, Matthew, MD    Allergies    Patient has no known allergies.  Review of Systems   Review of Systems  Constitutional: Positive  for fatigue. Negative for chills and fever.  HENT: Negative for congestion.   Eyes: Negative for pain.  Respiratory: Negative for cough and shortness of breath.   Cardiovascular: Negative for chest pain and leg swelling.  Gastrointestinal: Positive for abdominal pain. Negative for diarrhea, nausea and vomiting.  Genitourinary: Negative for dysuria.  Musculoskeletal: Negative for myalgias.  Skin: Negative for rash.  Neurological: Negative for dizziness and headaches.    Physical Exam Updated Vital Signs BP 99/63   Pulse 68   Temp 98.4 F (36.9 C) (Oral)   Resp 15   Ht   (1.727 m)   Wt 63.5 kg   SpO2 100%   BMI 21.29 kg/m   Physical Exam Vitals and nursing note reviewed.  Constitutional:      General: He is in acute distress.     Comments: 34 year old male appears stated age.  Appears acutely uncomfortable.  Slender body habitus.  HENT:     Head: Normocephalic and atraumatic.     Nose: Nose normal.  Eyes:     General: No scleral icterus. Cardiovascular:     Rate and Rhythm: Normal rate and regular rhythm.     Pulses: Normal pulses.     Heart sounds: Normal heart sounds.  Pulmonary:     Effort: Pulmonary effort is normal. No respiratory distress.     Breath sounds: Normal breath sounds. No wheezing.  Abdominal:     Palpations: Abdomen is soft.     Tenderness: There is abdominal tenderness. There is guarding. Positive signs include psoas sign.     Comments: Significant tenderness to palpation of the right lower quadrant, right upper quadrant, epigastrium and suprapubic region as well as less severe tenderness to palpation of the left upper quadrant and periumbilical region.  In the areas of worse tenderness-right lower quadrant and right upper quadrant-patient has guarding that is involuntary.  He is also voluntarily guarding in anticipation of palpation.  Genitourinary:    Testes: Normal.     Comments: Normal lie of testes Musculoskeletal:     Cervical back: Normal range of motion.     Right lower leg: No edema.     Left lower leg: No edema.  Skin:    General: Skin is warm and dry.     Capillary Refill: Capillary refill takes less than 2 seconds.     Comments: Two healing surgical marks on abdomen.  Dermabond still present with sutures below.  No evidence of infection of incision sites.  No warmth or redness and no tenderness to touch.  Neurological:     Mental Status: He is alert. Mental status is at baseline.  Psychiatric:        Mood and Affect: Mood normal.        Behavior: Behavior normal.     ED Results / Procedures / Treatments    Labs (all labs ordered are listed, but only abnormal results are displayed) Labs Reviewed  COMPREHENSIVE METABOLIC PANEL - Abnormal; Notable for the following components:      Result Value   Sodium 132 (*)    Chloride 93 (*)    All other components within normal limits  CBC - Abnormal; Notable for the following components:   WBC 11.9 (*)    Hemoglobin 11.3 (*)    HCT 36.4 (*)    MCH 25.2 (*)    All other components within normal limits  URINALYSIS, ROUTINE W REFLEX MICROSCOPIC - Abnormal; Notable for the following components:   Color, Urine  AMBER (*)    Ketones, ur 20 (*)    All other components within normal limits  RESP PANEL BY RT-PCR (FLU A&B, COVID) ARPGX2  LIPASE, BLOOD  PROTIME-INR    EKG None  Radiology CT ABDOMEN PELVIS W CONTRAST  Result Date: 09/07/2020 CLINICAL DATA:  34 year old with history of laparoscopic appendectomy on 08/13/2020. Patient presents with right lower quadrant pain. EXAM: CT ABDOMEN AND PELVIS WITH CONTRAST TECHNIQUE: Multidetector CT imaging of the abdomen and pelvis was performed using the standard protocol following bolus administration of intravenous contrast. CONTRAST:  OMNIPAQUE IOHEXOL 300 MG/ML  SOLN COMPARISON:  CT abdomen pelvis 08/12/2020 FINDINGS: Lower chest: Lung bases are clear.  No pleural effusions. Hepatobiliary: Normal appearance of the gallbladder without distension. Stable tiny low-density focus in left hepatic lobe may be related to the biliary system. Hepatic veins are patent. Main portal venous system is patent. Soft tissue thickening or complex fluid along the anterior and inferior aspect of the liver on sequence 3, image 37. This thickening measures roughly 7 mm in thickness. Pancreas: Unremarkable. No pancreatic ductal dilatation or surrounding inflammatory changes. Spleen: Normal in size without focal abnormality. Adrenals/Urinary Tract: Normal appearance of the adrenal glands. Normal appearance of both kidneys without  hydronephrosis. Small amount of fluid in the urinary bladder. Mild urinary bladder wall thickening is nonspecific. Stomach/Bowel: Wall thickening involving the rectum, best seen on sequence 3, image 75. Complex peripherally enhancing fluid collection is situated between the sigmoid colon and the rectum series 3, image 71. Mild wall thickening involving the sigmoid colon. Changes compatible with an appendectomy at the cecum. Negative for bowel obstruction. Normal appearance of stomach. Vascular/Lymphatic: Vascular structures are unremarkable. No significant lymph node enlargement in the abdomen or pelvis. Slightly prominent lymph node between the aorta and inferior vena cava on sequence 6 image 43 measures up to 1.2 cm, previously measured 1.1 cm. Overall, no significant lymph node enlargement. Reproductive: Prostate is unremarkable. Other: There is a complex small fluid collection with probable peripheral enhancement situated anterior to the rectum and posterior to the sigmoid colon on sequence 3, image 72. This is a compatible with an abscess that measures roughly 3.3 x 1.6 x 3.8 cm. There is adjacent wall thickening in the rectum and sigmoid colon. Irregular low-density collection with peripheral enhancement in the right upper abdomen along the inferior aspect of the liver. This collection measures 4.2 x 3.0 x 4.1 cm and compatible with an abscess. There is adjacent loculated fluid or soft tissue thickening just anterior to this collection and adjacent to the liver. Fluid filled structures in the right lower abdomen on sequence 3, image 63 probably represent small bowel loops with some wall thickening in this area. Difficult to exclude in a small interloop abscess in this area. Small amount of presacral edema. Musculoskeletal: No acute bone abnormality. IMPRESSION: 1. Inflammatory changes in the pelvis and right side of the abdomen with abscess collections. Irregular abscess in the anterior right abdomen that  measures up to 4.2 cm. Surrounding inflammatory changes in the right anterior abdomen and along the inferior aspect of the liver. Second abscess in the pelvis anterior to the rectum, measures up to 3.8 cm. 2. Inflammatory changes in the right lower quadrant associated with small bowel. Difficult to exclude an interloop abscess in the right lower quadrant. 3. Wall thickening involving the rectum and sigmoid colon is likely related to the adjacent abscess. Electronically Signed   By: Richarda Overlie M.D.   On: 09/07/2020 12:44  Procedures Procedures (including critical care time)  Medications Ordered in ED Medications  enoxaparin (LOVENOX) injection 40 mg (has no administration in time range)  dextrose 5 % and 0.45 % NaCl with KCl 20 mEq/L infusion (has no administration in time range)  piperacillin-tazobactam (ZOSYN) IVPB 3.375 g (has no administration in time range)  acetaminophen (TYLENOL) tablet 650 mg (has no administration in time range)  oxyCODONE (Oxy IR/ROXICODONE) immediate release tablet 5-10 mg (has no administration in time range)  morphine 2 MG/ML injection 1-4 mg (has no administration in time range)  methocarbamol (ROBAXIN) 500 mg in dextrose 5 % 50 mL IVPB (has no administration in time range)  diphenhydrAMINE (BENADRYL) capsule 25 mg (has no administration in time range)    Or  diphenhydrAMINE (BENADRYL) injection 25 mg (has no administration in time range)  ondansetron (ZOFRAN-ODT) disintegrating tablet 4 mg (has no administration in time range)    Or  ondansetron (ZOFRAN) injection 4 mg (has no administration in time range)  simethicone (MYLICON) chewable tablet 40 mg (has no administration in time range)  metoprolol tartrate (LOPRESSOR) injection 5 mg (has no administration in time range)  sodium chloride 0.9 % bolus 1,000 mL (0 mLs Intravenous Stopped 09/07/20 1107)  lactated ringers bolus 1,000 mL (1,000 mLs Intravenous New Bag/Given 09/07/20 1112)  iohexol (OMNIPAQUE) 300  MG/ML solution 100 mL (100 mLs Intravenous Contrast Given 09/07/20 1153)  lactated ringers bolus 1,000 mL (1,000 mLs Intravenous New Bag/Given 09/07/20 1357)    ED Course  I have reviewed the triage vital signs and the nursing notes.  Pertinent labs & imaging results that were available during my care of the patient were reviewed by me and considered in my medical decision making (see chart for details).  Patient is a 34 year old male with past medical history detailed above presented today for right lower quadrant, right upper quadrant and epigastric abdominal pain.  He had perforated appendicitis 1 month ago with abscess and phlegmon at that time.  He was discharged on Augmentin.  He has had intermittent abdominal pain since associated with some constipation however this is resolved then 3 days ago he has had new onset of worsening severe abdominal pain that he states began significantly worse today.  He came to the ER for further evaluation.  On my examination patient has signs of peritonitis with involuntary guarding in the right lower quadrant and right upper quadrant. He has well-healed surgical scars in his abdomen that do not appear infected. I have concern for intra-abdominal abscess given his recent surgery.  CBC w/ leukocytosis.  CMP with mild hyponatremia / hypochloremia -- will provide with crystalloid. UA unremarkable apart from ketones d/t dec appetite/intake COVID/FLU negative  CT scan obtained to evaluate for this.  Which confirms intra-abdominal abscess, at least two visualized by radiology. IMPRESSION:  1. Inflammatory changes in the pelvis and right side of the abdomen  with abscess collections. Irregular abscess in the anterior right  abdomen that measures up to 4.2 cm. Surrounding inflammatory changes  in the right anterior abdomen and along the inferior aspect of the  liver. Second abscess in the pelvis anterior to the rectum, measures  up to 3.8 cm.  2. Inflammatory  changes in the right lower quadrant associated with  small bowel. Difficult to exclude an interloop abscess in the right  lower quadrant.  3. Wall thickening involving the rectum and sigmoid colon is likely  related to the adjacent abscess.   Patient has received 2 L of crystalloid.  Will begin a 3rd L at rate of 250/h as patient does have borderline hypotesive Bps. Is mentating well.   Clinical Course as of Sep 08 1423  Tue Sep 07, 2020  1121 Morphine DC-ed by myself prior to administration but after it was pulled by RN staff. It was administed at 11am. RN staff informed me of this once they left room and saw order had changed. BP WNLs currently at 100/70s   [WF]  1328 Discussed with general surgery who will assess patient at bedside. She will order abx and will admit to medicine. Patient aware of plan for admission.    [WF]    Clinical Course User Index [WF] Gailen Shelter, [place]   MDM Rules/Calculators/A&P                          Patient admitted by general surgery.  Final Clinical Impression(s) / ED Diagnoses Final diagnoses:  Postoperative intra-abdominal abscess  Right lower quadrant abdominal pain    Rx / DC Orders ED Discharge Orders    None       Gailen Shelter, [place] 09/07/20 1528    Eber Hong, MD 09/08/20 [phone]

## 2020-09-07 NOTE — Telephone Encounter (Signed)
Reviewed Epic with RN Rolly Salter earlier today and noted patient abdomen CT results/admission for abscesses.  Noted HR notified patient will not be into work and was admitted to hospital.

## 2020-09-07 NOTE — ED Provider Notes (Signed)
Patient is a 34 year old male recent appendectomy after microperforation and abscess, presents approximately 1 month later with increasing pain, on my exam he has tenderness to palpation of the right lower right upper and left upper but not so much in the left lower.  He is guarding and has some mild peritoneal signs.  His heart rate is about 90, blood pressure is between 90 and 105 systolic and he is afebrile.  He does have a leukocytosis, his liver and renal functions are preserved, no hyperbilirubinemia, the patient will need CT scan to further evaluate, IV fluids for hypotension and some pain medication.  Would consider ongoing or recurrent perforation or abscess or other surgical emergency.  Medical screening examination/treatment/procedure(s) were conducted as a shared visit with non-physician practitioner(s) and myself.  I personally evaluated the patient during the encounter.  Clinical Impression:   Final diagnoses:  Postoperative intra-abdominal abscess  Right lower quadrant abdominal pain         Eber Hong, MD 09/08/20 [phone]

## 2020-09-07 NOTE — Telephone Encounter (Signed)
Per chart review, pt found to have multiple intraabdominal abscesses. Disposition currently admission. Awaiting hospitalist. HR notified that pt will not be RTW for at least a few days and that they will need to contact the pt for additional information.

## 2020-09-07 NOTE — ED Notes (Signed)
Pt given urinal.

## 2020-09-08 ENCOUNTER — Inpatient Hospital Stay (HOSPITAL_COMMUNITY): Payer: Self-pay

## 2020-09-08 LAB — BASIC METABOLIC PANEL
Anion gap: 12 (ref 5–15)
BUN: 10 mg/dL (ref 6–20)
CO2: 25 mmol/L (ref 22–32)
Calcium: 8.3 mg/dL — ABNORMAL LOW (ref 8.9–10.3)
Chloride: 94 mmol/L — ABNORMAL LOW (ref 98–111)
Creatinine, Ser: 0.78 mg/dL (ref 0.61–1.24)
GFR, Estimated: >60 mL/min (ref >60)
Glucose, Bld: 130 mg/dL — ABNORMAL HIGH (ref 70–99)
Potassium: 3.7 mmol/L (ref 3.5–5.1)
Sodium: 131 mmol/L — ABNORMAL LOW (ref 135–145)

## 2020-09-08 LAB — CBC
HCT: 30.4 % — ABNORMAL LOW (ref 39.0–52.0)
Hemoglobin: 10.1 g/dL — ABNORMAL LOW (ref 13.0–17.0)
MCH: 25.7 pg — ABNORMAL LOW (ref 26.0–34.0)
MCHC: 33.2 g/dL (ref 30.0–36.0)
MCV: 77.4 fL — ABNORMAL LOW (ref 80.0–100.0)
Platelets: 182 10*3/uL (ref 150–400)
RBC: 3.93 MIL/uL — ABNORMAL LOW (ref 4.22–5.81)
RDW: 12.5 % (ref 11.5–15.5)
WBC: 12.4 10*3/uL — ABNORMAL HIGH (ref 4.0–10.5)
nRBC: 0 % (ref 0.0–0.2)

## 2020-09-08 LAB — PROTIME-INR
INR: 1.2 (ref 0.8–1.2)
Prothrombin Time: 14.4 seconds (ref 11.4–15.2)

## 2020-09-08 MED ORDER — INFLUENZA VAC SPLIT QUAD 0.5 ML IM SUSY
0.5000 mL | PREFILLED_SYRINGE | INTRAMUSCULAR | Status: AC
  Administered 2020-09-14: 10:00:00 0.5 mL via INTRAMUSCULAR
  Filled 2020-09-08 (×2): qty 0.5

## 2020-09-08 MED ORDER — MIDAZOLAM HCL 2 MG/2ML IJ SOLN
INTRAMUSCULAR | Status: AC | PRN
  Administered 2020-09-08 (×4): 0.5 mg via INTRAVENOUS

## 2020-09-08 MED ORDER — FENTANYL CITRATE (PF) 100 MCG/2ML IJ SOLN
INTRAMUSCULAR | Status: AC
  Filled 2020-09-08: qty 2

## 2020-09-08 MED ORDER — SODIUM CHLORIDE 0.9% FLUSH
5.0000 mL | Freq: Three times a day (TID) | INTRAVENOUS | Status: DC
  Administered 2020-09-08 – 2020-09-14 (×18): 5 mL

## 2020-09-08 MED ORDER — FENTANYL CITRATE (PF) 100 MCG/2ML IJ SOLN
INTRAMUSCULAR | Status: AC | PRN
Start: 2020-09-08 — End: 2020-09-08
  Administered 2020-09-08 (×4): 25 ug via INTRAVENOUS

## 2020-09-08 MED ORDER — MIDAZOLAM HCL 2 MG/2ML IJ SOLN
INTRAMUSCULAR | Status: AC
  Filled 2020-09-08: qty 2

## 2020-09-08 NOTE — Procedures (Signed)
Interventional Radiology Procedure Note  Procedure: CT guided drainage of right anterior peritoneal abscess  Complications: None  Estimated Blood Loss: < 10 mL  Findings: Right mid anterior peritoneal abscess yielded purulent, bloody fluid. Sample sent for culture. 10 Fr drain placed and attached to suction bulb.  Jodi Marble. Fredia Sorrow, M.D Pager:  [phone]

## 2020-09-08 NOTE — Progress Notes (Signed)
Patient arrived to 6N02. Report received from [place], [place]. Patient alert and oriented x4. See assessment. See MAR. Patient call bell within reach. Will continue to monitor.

## 2020-09-08 NOTE — Progress Notes (Signed)
Subjective: CC: Patient notes that he continues to have moderate-severe pain in his right upper quadrant, right lower quadrant and suprapubic abdomen.  His pain is improved and tolerable after pain medication.  No nausea/vomiting overnight.  Objective: Vital signs in last 24 hours: Temp:  [98.4 F (36.9 C)-100.1 F (37.8 C)] 98.5 F (36.9 C) (12/08 0511) Pulse Rate:  [60-90] 90 (12/08 0511) Resp:  [12-20] 15 (12/08 0511) BP: (90-111)/(58-72) 107/72 (12/08 0511) SpO2:  [97 %-100 %] 100 % (12/08 0511)    Intake/Output from previous day: 12/07 0701 - 12/08 0700 In: 3102.3 [I.V.:1002.3; IV Piggyback:2100] Out: -  Intake/Output this shift: No intake/output data recorded.  PE: Gen:  Alert, NAD, pleasant Card:  RRR Pulm:  CTAB, no W/R/R, effort normal Abd: Soft, ND, RUQ, RLQ and suprapubic tenderness, +BS, Incisions with glue intact appears well and are without drainage, bleeding, or signs of infection Ext:  No LE edema  Psych: A&Ox3  Skin: no rashes noted, warm and dry   Lab Results:  Recent Labs    09/06/20 1600 09/08/20 0011  WBC 11.9* 12.4*  HGB 11.3* 10.1*  HCT 36.4* 30.4*  PLT 216 182   BMET Recent Labs    09/06/20 1600 09/08/20 0011  NA 132* 131*  K 4.1 3.7  CL 93* 94*  CO2 24 25  GLUCOSE 94 130*  BUN 11 10  CREATININE 0.83 0.78  CALCIUM 9.1 8.3*   PT/INR Recent Labs    09/08/20 0011  LABPROT 14.4  INR 1.2   CMP     Component Value Date/Time   NA 131 (L) 09/08/2020 0011   K 3.7 09/08/2020 0011   CL 94 (L) 09/08/2020 0011   CO2 25 09/08/2020 0011   GLUCOSE 130 (H) 09/08/2020 0011   BUN 10 09/08/2020 0011   CREATININE 0.78 09/08/2020 0011   CALCIUM 8.3 (L) 09/08/2020 0011   PROT 7.6 09/06/2020 1600   ALBUMIN 3.5 09/06/2020 1600   AST 19 09/06/2020 1600   ALT 20 09/06/2020 1600   ALKPHOS 55 09/06/2020 1600   BILITOT 0.6 09/06/2020 1600   GFRNONAA >60 09/08/2020 0011   GFRAA >60 08/08/2018 0336   Lipase     Component Value  Date/Time   LIPASE 21 09/06/2020 1600       Studies/Results: CT ABDOMEN PELVIS W CONTRAST  Result Date: 09/07/2020 CLINICAL DATA:  34 year old with history of laparoscopic appendectomy on 08/13/2020. Patient presents with right lower quadrant pain. EXAM: CT ABDOMEN AND PELVIS WITH CONTRAST TECHNIQUE: Multidetector CT imaging of the abdomen and pelvis was performed using the standard protocol following bolus administration of intravenous contrast. CONTRAST:  OMNIPAQUE IOHEXOL 300 MG/ML  SOLN COMPARISON:  CT abdomen pelvis 08/12/2020 FINDINGS: Lower chest: Lung bases are clear.  No pleural effusions. Hepatobiliary: Normal appearance of the gallbladder without distension. Stable tiny low-density focus in left hepatic lobe may be related to the biliary system. Hepatic veins are patent. Main portal venous system is patent. Soft tissue thickening or complex fluid along the anterior and inferior aspect of the liver on sequence 3, image 37. This thickening measures roughly 7 mm in thickness. Pancreas: Unremarkable. No pancreatic ductal dilatation or surrounding inflammatory changes. Spleen: Normal in size without focal abnormality. Adrenals/Urinary Tract: Normal appearance of the adrenal glands. Normal appearance of both kidneys without hydronephrosis. Small amount of fluid in the urinary bladder. Mild urinary bladder wall thickening is nonspecific. Stomach/Bowel: Wall thickening involving the rectum, best seen on sequence 3, image  75. Complex peripherally enhancing fluid collection is situated between the sigmoid colon and the rectum series 3, image 71. Mild wall thickening involving the sigmoid colon. Changes compatible with an appendectomy at the cecum. Negative for bowel obstruction. Normal appearance of stomach. Vascular/Lymphatic: Vascular structures are unremarkable. No significant lymph node enlargement in the abdomen or pelvis. Slightly prominent lymph node between the aorta and inferior vena cava  on sequence 6 image 43 measures up to 1.2 cm, previously measured 1.1 cm. Overall, no significant lymph node enlargement. Reproductive: Prostate is unremarkable. Other: There is a complex small fluid collection with probable peripheral enhancement situated anterior to the rectum and posterior to the sigmoid colon on sequence 3, image 72. This is a compatible with an abscess that measures roughly 3.3 x 1.6 x 3.8 cm. There is adjacent wall thickening in the rectum and sigmoid colon. Irregular low-density collection with peripheral enhancement in the right upper abdomen along the inferior aspect of the liver. This collection measures 4.2 x 3.0 x 4.1 cm and compatible with an abscess. There is adjacent loculated fluid or soft tissue thickening just anterior to this collection and adjacent to the liver. Fluid filled structures in the right lower abdomen on sequence 3, image 63 probably represent small bowel loops with some wall thickening in this area. Difficult to exclude in a small interloop abscess in this area. Small amount of presacral edema. Musculoskeletal: No acute bone abnormality. IMPRESSION: 1. Inflammatory changes in the pelvis and right side of the abdomen with abscess collections. Irregular abscess in the anterior right abdomen that measures up to 4.2 cm. Surrounding inflammatory changes in the right anterior abdomen and along the inferior aspect of the liver. Second abscess in the pelvis anterior to the rectum, measures up to 3.8 cm. 2. Inflammatory changes in the right lower quadrant associated with small bowel. Difficult to exclude an interloop abscess in the right lower quadrant. 3. Wall thickening involving the rectum and sigmoid colon is likely related to the adjacent abscess. Electronically Signed   By: Richarda Overlie M.D.   On: 09/07/2020 12:44    Anti-infectives: Anti-infectives (From admission, onward)   Start     Dose/Rate Route Frequency Ordered Stop   09/07/20 1500  piperacillin-tazobactam  (ZOSYN) IVPB 3.375 g        3.375 g 12.5 mL/hr over 240 Minutes Intravenous Every 8 hours 09/07/20 1410         Assessment/Plan S/p lap appy for perforated appendicitis on 11/12 by Dr. Magnus Ivan  Intra-abdominal abscesses - Cont IV abx - IR plans for RUQ percutaneous drainage today. Pelvic and RLQ collections likely not amenable to percutaneous drainage. Will need close monitoring over the next few days to ensure does not need additional CT scan, additional drainage vs washout. Hopefully this will improve with non-operative management.  - Okay for clears after IR procedure - Trend labs - Mobilize, pulm toilet  FEN - NPO for IR drainage. Okay for CLD after  VTE - SCDs, Lovenox  ID - Zosyn 12/7 >> WBC 12.4, Tmax 100.1 Foley - None    LOS: 1 day    Jacinto Halim , [facility] Surgery 09/08/2020, 8:03 AM Please see Amion for pager number during day hours 7:00am-4:30pm

## 2020-09-09 LAB — CBC
HCT: 32 % — ABNORMAL LOW (ref 39.0–52.0)
Hemoglobin: 10.6 g/dL — ABNORMAL LOW (ref 13.0–17.0)
MCH: 25.9 pg — ABNORMAL LOW (ref 26.0–34.0)
MCHC: 33.1 g/dL (ref 30.0–36.0)
MCV: 78.2 fL — ABNORMAL LOW (ref 80.0–100.0)
Platelets: 195 10*3/uL (ref 150–400)
RBC: 4.09 MIL/uL — ABNORMAL LOW (ref 4.22–5.81)
RDW: 12.6 % (ref 11.5–15.5)
WBC: 8.8 10*3/uL (ref 4.0–10.5)
nRBC: 0 % (ref 0.0–0.2)

## 2020-09-09 LAB — BASIC METABOLIC PANEL
Anion gap: 14 (ref 5–15)
BUN: 6 mg/dL (ref 6–20)
CO2: 25 mmol/L (ref 22–32)
Calcium: 8.8 mg/dL — ABNORMAL LOW (ref 8.9–10.3)
Chloride: 95 mmol/L — ABNORMAL LOW (ref 98–111)
Creatinine, Ser: 0.87 mg/dL (ref 0.61–1.24)
GFR, Estimated: >60 mL/min (ref >60)
Glucose, Bld: 105 mg/dL — ABNORMAL HIGH (ref 70–99)
Potassium: 4 mmol/L (ref 3.5–5.1)
Sodium: 134 mmol/L — ABNORMAL LOW (ref 135–145)

## 2020-09-09 MED ORDER — DOCUSATE SODIUM 100 MG PO CAPS
100.0000 mg | ORAL_CAPSULE | Freq: Two times a day (BID) | ORAL | Status: DC
  Administered 2020-09-09 – 2020-09-14 (×11): 100 mg via ORAL
  Filled 2020-09-09 (×11): qty 1

## 2020-09-09 MED ORDER — METHOCARBAMOL 1000 MG/10ML IJ SOLN
500.0000 mg | Freq: Three times a day (TID) | INTRAVENOUS | Status: DC
  Administered 2020-09-09 – 2020-09-13 (×12): 500 mg via INTRAVENOUS
  Filled 2020-09-09 (×3): qty 5
  Filled 2020-09-09: qty 500
  Filled 2020-09-09 (×7): qty 5

## 2020-09-09 MED ORDER — OXYCODONE HCL 5 MG PO TABS
5.0000 mg | ORAL_TABLET | ORAL | Status: DC | PRN
  Administered 2020-09-09 – 2020-09-11 (×5): 10 mg via ORAL
  Filled 2020-09-09 (×5): qty 2

## 2020-09-09 MED ORDER — ACETAMINOPHEN 500 MG PO TABS
1000.0000 mg | ORAL_TABLET | Freq: Four times a day (QID) | ORAL | Status: DC
  Administered 2020-09-09 – 2020-09-14 (×18): 1000 mg via ORAL
  Filled 2020-09-09 (×19): qty 2

## 2020-09-09 MED ORDER — SODIUM CHLORIDE 0.9 % IV BOLUS
500.0000 mL | Freq: Once | INTRAVENOUS | Status: AC
  Administered 2020-09-09: 500 mL via INTRAVENOUS

## 2020-09-09 MED ORDER — MORPHINE SULFATE (PF) 2 MG/ML IV SOLN: 2.0000 mg | INTRAVENOUS | Status: DC | PRN

## 2020-09-09 NOTE — Plan of Care (Signed)
  Problem: Education: Goal: Knowledge of General Education information will improve Description Including pain rating scale, medication(s)/side effects and non-pharmacologic comfort measures Outcome: Progressing   

## 2020-09-09 NOTE — Plan of Care (Signed)
  Problem: Education: Goal: Knowledge of General Education information will improve Description: Including pain rating scale, medication(s)/side effects and non-pharmacologic comfort measures Outcome: Progressing   Problem: Clinical Measurements: Goal: Respiratory complications will improve Outcome: Not Applicable   Problem: Activity: Goal: Risk for activity intolerance will decrease Outcome: Progressing   Problem: Coping: Goal: Level of anxiety will decrease Outcome: Progressing

## 2020-09-09 NOTE — Progress Notes (Signed)
Referring Physician(s): Barnetta Chapel ([facility])  Supervising Physician: Oley Balm  Patient Status:  [facility] - In-pt  Chief Complaint: "Abdominal pain"  Subjective:  History of perforated appendicitis s/p lap appendectomy in OR 08/13/2020 by Dr. Magnus Ivan; complicated by development appendiceal abscess s/p RLQ drain placement in IR 09/08/2020. Patient awake and alert laying in bed. Complains of tenderness at drain site, as expected, along with generalized abdominal pain rated 8-9/10 at this time. RLQ drain site c/d/i.   Allergies: Patient has no known allergies.  Medications: Prior to Admission medications   Medication Sig Start Date End Date Taking? Authorizing Provider  acetaminophen (TYLENOL) 325 MG tablet Take 325 mg by mouth every 6 (six) hours as needed for mild pain or headache.   Yes [provider]  polyethylene glycol (MIRALAX / GLYCOLAX) 17 g packet Take 17 g by mouth daily as needed for mild constipation.   Yes [provider]  HYDROcodone-acetaminophen (NORCO/VICODIN) 5-325 MG tablet Take 1 tablet by mouth every 6 (six) hours as needed for moderate pain. Patient not taking: Reported on 09/07/2020 08/14/20   Luretha Murphy, MD     Vital Signs: BP (!) 97/57 (BP Location: Left Arm)   Pulse 71   Temp 97.7 F (36.5 C) (Oral)   Resp 16   Ht 5\' 8"  (1.727 m)   Wt 139 lb 15.9 oz (63.5 kg)   SpO2 100%   BMI 21.29 kg/m   Physical Exam Vitals and nursing note reviewed.  Constitutional:      General: He is not in acute distress. Pulmonary:     Effort: Pulmonary effort is normal. No respiratory distress.  Abdominal:     Comments: RLQ drain site with mild tenderness, no erythema, drainage, or active bleeding; approximately 10-15 cc clear yellow fluid with debris in suction bulb.  Skin:    General: Skin is warm and dry.  Neurological:     Mental Status: He is alert and oriented to person, place, and time.     Imaging: CT ABDOMEN PELVIS W  CONTRAST  Result Date: 09/07/2020 CLINICAL DATA:  34 year old with history of laparoscopic appendectomy on 08/13/2020. Patient presents with right lower quadrant pain. EXAM: CT ABDOMEN AND PELVIS WITH CONTRAST TECHNIQUE: Multidetector CT imaging of the abdomen and pelvis was performed using the standard protocol following bolus administration of intravenous contrast. CONTRAST:  13/09/2020 OMNIPAQUE IOHEXOL 300 MG/ML  SOLN COMPARISON:  CT abdomen pelvis 08/12/2020 FINDINGS: Lower chest: Lung bases are clear.  No pleural effusions. Hepatobiliary: Normal appearance of the gallbladder without distension. Stable tiny low-density focus in left hepatic lobe may be related to the biliary system. Hepatic veins are patent. Main portal venous system is patent. Soft tissue thickening or complex fluid along the anterior and inferior aspect of the liver on sequence 3, image 37. This thickening measures roughly 7 mm in thickness. Pancreas: Unremarkable. No pancreatic ductal dilatation or surrounding inflammatory changes. Spleen: Normal in size without focal abnormality. Adrenals/Urinary Tract: Normal appearance of the adrenal glands. Normal appearance of both kidneys without hydronephrosis. Small amount of fluid in the urinary bladder. Mild urinary bladder wall thickening is nonspecific. Stomach/Bowel: Wall thickening involving the rectum, best seen on sequence 3, image 75. Complex peripherally enhancing fluid collection is situated between the sigmoid colon and the rectum series 3, image 71. Mild wall thickening involving the sigmoid colon. Changes compatible with an appendectomy at the cecum. Negative for bowel obstruction. Normal appearance of stomach. Vascular/Lymphatic: Vascular structures are unremarkable. No significant lymph node enlargement  in the abdomen or pelvis. Slightly prominent lymph node between the aorta and inferior vena cava on sequence 6 image 43 measures up to 1.2 cm, previously measured 1.1 cm. Overall, no  significant lymph node enlargement. Reproductive: Prostate is unremarkable. Other: There is a complex small fluid collection with probable peripheral enhancement situated anterior to the rectum and posterior to the sigmoid colon on sequence 3, image 72. This is a compatible with an abscess that measures roughly 3.3 x 1.6 x 3.8 cm. There is adjacent wall thickening in the rectum and sigmoid colon. Irregular low-density collection with peripheral enhancement in the right upper abdomen along the inferior aspect of the liver. This collection measures 4.2 x 3.0 x 4.1 cm and compatible with an abscess. There is adjacent loculated fluid or soft tissue thickening just anterior to this collection and adjacent to the liver. Fluid filled structures in the right lower abdomen on sequence 3, image 63 probably represent small bowel loops with some wall thickening in this area. Difficult to exclude in a small interloop abscess in this area. Small amount of presacral edema. Musculoskeletal: No acute bone abnormality. IMPRESSION: 1. Inflammatory changes in the pelvis and right side of the abdomen with abscess collections. Irregular abscess in the anterior right abdomen that measures up to 4.2 cm. Surrounding inflammatory changes in the right anterior abdomen and along the inferior aspect of the liver. Second abscess in the pelvis anterior to the rectum, measures up to 3.8 cm. 2. Inflammatory changes in the right lower quadrant associated with small bowel. Difficult to exclude an interloop abscess in the right lower quadrant. 3. Wall thickening involving the rectum and sigmoid colon is likely related to the adjacent abscess. Electronically Signed   By: Richarda Overlie M.D.   On: 09/07/2020 12:44   CT IMAGE GUIDED DRAINAGE BY PERCUTANEOUS CATHETER  Result Date: 09/08/2020 CLINICAL DATA:  Postoperative abscesses after prior appendectomy for perforated appendicitis on 08/13/2020. EXAM: CT GUIDED CATHETER DRAINAGE OF PERITONEAL ABSCESS  ANESTHESIA/SEDATION: 2.0 mg IV Versed 100 mcg IV Fentanyl Total Moderate Sedation Time:  23 minutes The patient's level of consciousness and physiologic status were continuously monitored during the procedure by Radiology nursing. PROCEDURE: The procedure, risks, benefits, and alternatives were explained to the patient. Questions regarding the procedure were encouraged and answered. The patient understands and consents to the procedure. A time out was performed prior to initiating the procedure. CT was performed through the abdomen in a supine position. The right anterior abdominal wall was prepped with chlorhexidine in a sterile fashion, and a sterile drape was applied covering the operative field. A sterile gown and sterile gloves were used for the procedure. Local anesthesia was provided with 1% Lidocaine. Under CT guidance, an 18 gauge trocar needle was advanced to the level of a right anterior peritoneal abscess. After confirming needle tip position, a guidewire was advanced and the tract dilated. A 10 French percutaneous drainage catheter was advanced over the wire and formed. A fluid sample was obtained and sent for culture analysis. The catheter was flushed and connected to a suction bulb. It was secured at the skin with a Prolene retention suture and StatLock device. COMPLICATIONS: None FINDINGS: The right anterior peritoneal abscess yielded purulent bloody fluid. After drain placement there is continued return of fluid. IMPRESSION: CT-guided percutaneous catheter drainage of right anterior peritoneal abscess yielding purulent bloody fluid. A 10 French drainage catheter was placed and attached to suction bulb drainage. Electronically Signed   By: Rudene Anda.D.  On: 09/08/2020 17:52    Labs:  CBC: Recent Labs    08/13/20 0148 09/06/20 1600 09/08/20 0011 09/09/20 0826  WBC 8.6 11.9* 12.4* 8.8  HGB 11.7* 11.3* 10.1* 10.6*  HCT 35.6* 36.4* 30.4* 32.0*  PLT 108* 216 182 195     COAGS: Recent Labs    09/08/20 0011  INR 1.2    BMP: Recent Labs    08/13/20 0148 09/06/20 1600 09/08/20 0011 09/09/20 0826  NA 137 132* 131* 134*  K 3.6 4.1 3.7 4.0  CL 102 93* 94* 95*  CO2 26 24 25 25   GLUCOSE 112* 94 130* 105*  BUN 12 11 10 6   CALCIUM 8.7* 9.1 8.3* 8.8*  CREATININE 1.02 0.83 0.78 0.87  GFRNONAA >60 >60 >60 >60    LIVER FUNCTION TESTS: Recent Labs    08/12/20 1518 09/06/20 1600  BILITOT 1.0 0.6  AST 18 19  ALT 15 20  ALKPHOS 54 55  PROT 7.5 7.6  ALBUMIN 4.1 3.5    Assessment and Plan:  History of perforated appendicitis s/p lap appendectomy in OR 08/13/2020 by Dr. 14/06/21; complicated by development appendiceal abscess s/p RLQ drain placement in IR 09/08/2020. RLQ drain stable with approximately 10-15 cc clear yellow fluid with debris in suction bulb (additional 60 cc output from drain in past 24 hours per chart). Continue current drain management- continue with Qshift flushes/monitor of output. Plan for repeat CT/possible drain injection when output <10 cc/day (assess for possible removal). Further plans per [facility]- appreciate and agree with management. IR to follow.   Electronically Signed: Magnus Ivan, PA-C 09/09/2020, 4:18 PM   I spent a total of 25 Minutes at the the patient's bedside AND on the patient's hospital floor or unit, greater than 50% of which was counseling/coordinating care for appendiceal abscess s/p RLQ drain placement.

## 2020-09-09 NOTE — Telephone Encounter (Signed)
Drain placed and patient continuing antibiotics hospitalized.

## 2020-09-09 NOTE — Progress Notes (Signed)
Subjective: CC: Patient is s/p Perc drain by IR yesterday of the right mid anterior peritoneal abscess yielding purulent, bloody fluid. Patient reports that he is still having right sided and suprapubic abdominal pain that is moderate-severe. Notes his pain is worse after drinking water. No n/v. Passing flatus. No BM. Only mobilizing to and from the bathroom.   Objective: Vital signs in last 24 hours: Temp:  [98.3 F (36.8 C)-98.9 F (37.2 C)] 98.3 F (36.8 C) (12/09 0403) Pulse Rate:  [71-96] 71 (12/09 0403) Resp:  [7-34] 17 (12/09 0403) BP: (94-106)/(57-72) 100/64 (12/09 0403) SpO2:  [98 %-100 %] 98 % (12/09 0403) Last BM Date: 09/06/20  Intake/Output from previous day: 12/08 0701 - 12/09 0700 In: 2623.7 [P.O.:270; I.V.:2193.7; IV Piggyback:150] Out: 4575 [Urine:4525; Drains:50] Intake/Output this shift: No intake/output data recorded.  PE: Gen:  Alert, NAD, pleasant Card:  RRR Pulm:  CTAB, no W/R/R, effort normal Abd: Soft, ND, RUQ, RLQ and suprapubic tenderness similar to yesterday, +BS, Incisions with glue intact appears well and are without drainage, bleeding, or signs of infection. IR drain with SS output. 50cc recorded since placement.  Ext:  No LE edema  Psych: A&Ox3  Skin: no rashes noted, warm and dry  Lab Results:  Recent Labs    09/06/20 1600 09/08/20 0011  WBC 11.9* 12.4*  HGB 11.3* 10.1*  HCT 36.4* 30.4*  PLT 216 182   BMET Recent Labs    09/06/20 1600 09/08/20 0011  NA 132* 131*  K 4.1 3.7  CL 93* 94*  CO2 24 25  GLUCOSE 94 130*  BUN 11 10  CREATININE 0.83 0.78  CALCIUM 9.1 8.3*   PT/INR Recent Labs    09/08/20 0011  LABPROT 14.4  INR 1.2   CMP     Component Value Date/Time   NA 131 (L) 09/08/2020 0011   K 3.7 09/08/2020 0011   CL 94 (L) 09/08/2020 0011   CO2 25 09/08/2020 0011   GLUCOSE 130 (H) 09/08/2020 0011   BUN 10 09/08/2020 0011   CREATININE 0.78 09/08/2020 0011   CALCIUM 8.3 (L) 09/08/2020 0011   PROT 7.6  09/06/2020 1600   ALBUMIN 3.5 09/06/2020 1600   AST 19 09/06/2020 1600   ALT 20 09/06/2020 1600   ALKPHOS 55 09/06/2020 1600   BILITOT 0.6 09/06/2020 1600   GFRNONAA >60 09/08/2020 0011   GFRAA >60 08/08/2018 0336   Lipase     Component Value Date/Time   LIPASE 21 09/06/2020 1600       Studies/Results: CT ABDOMEN PELVIS W CONTRAST  Result Date: 09/07/2020 CLINICAL DATA:  34 year old with history of laparoscopic appendectomy on 08/13/2020. Patient presents with right lower quadrant pain. EXAM: CT ABDOMEN AND PELVIS WITH CONTRAST TECHNIQUE: Multidetector CT imaging of the abdomen and pelvis was performed using the standard protocol following bolus administration of intravenous contrast. CONTRAST:  OMNIPAQUE IOHEXOL 300 MG/ML  SOLN COMPARISON:  CT abdomen pelvis 08/12/2020 FINDINGS: Lower chest: Lung bases are clear.  No pleural effusions. Hepatobiliary: Normal appearance of the gallbladder without distension. Stable tiny low-density focus in left hepatic lobe may be related to the biliary system. Hepatic veins are patent. Main portal venous system is patent. Soft tissue thickening or complex fluid along the anterior and inferior aspect of the liver on sequence 3, image 37. This thickening measures roughly 7 mm in thickness. Pancreas: Unremarkable. No pancreatic ductal dilatation or surrounding inflammatory changes. Spleen: Normal in size without focal abnormality. Adrenals/Urinary Tract: Normal  appearance of the adrenal glands. Normal appearance of both kidneys without hydronephrosis. Small amount of fluid in the urinary bladder. Mild urinary bladder wall thickening is nonspecific. Stomach/Bowel: Wall thickening involving the rectum, best seen on sequence 3, image 75. Complex peripherally enhancing fluid collection is situated between the sigmoid colon and the rectum series 3, image 71. Mild wall thickening involving the sigmoid colon. Changes compatible with an appendectomy at the cecum.  Negative for bowel obstruction. Normal appearance of stomach. Vascular/Lymphatic: Vascular structures are unremarkable. No significant lymph node enlargement in the abdomen or pelvis. Slightly prominent lymph node between the aorta and inferior vena cava on sequence 6 image 43 measures up to 1.2 cm, previously measured 1.1 cm. Overall, no significant lymph node enlargement. Reproductive: Prostate is unremarkable. Other: There is a complex small fluid collection with probable peripheral enhancement situated anterior to the rectum and posterior to the sigmoid colon on sequence 3, image 72. This is a compatible with an abscess that measures roughly 3.3 x 1.6 x 3.8 cm. There is adjacent wall thickening in the rectum and sigmoid colon. Irregular low-density collection with peripheral enhancement in the right upper abdomen along the inferior aspect of the liver. This collection measures 4.2 x 3.0 x 4.1 cm and compatible with an abscess. There is adjacent loculated fluid or soft tissue thickening just anterior to this collection and adjacent to the liver. Fluid filled structures in the right lower abdomen on sequence 3, image 63 probably represent small bowel loops with some wall thickening in this area. Difficult to exclude in a small interloop abscess in this area. Small amount of presacral edema. Musculoskeletal: No acute bone abnormality. IMPRESSION: 1. Inflammatory changes in the pelvis and right side of the abdomen with abscess collections. Irregular abscess in the anterior right abdomen that measures up to 4.2 cm. Surrounding inflammatory changes in the right anterior abdomen and along the inferior aspect of the liver. Second abscess in the pelvis anterior to the rectum, measures up to 3.8 cm. 2. Inflammatory changes in the right lower quadrant associated with small bowel. Difficult to exclude an interloop abscess in the right lower quadrant. 3. Wall thickening involving the rectum and sigmoid colon is likely  related to the adjacent abscess. Electronically Signed   By: Richarda Overlie M.D.   On: 09/07/2020 12:44   CT IMAGE GUIDED DRAINAGE BY PERCUTANEOUS CATHETER  Result Date: 09/08/2020 CLINICAL DATA:  Postoperative abscesses after prior appendectomy for perforated appendicitis on 08/13/2020. EXAM: CT GUIDED CATHETER DRAINAGE OF PERITONEAL ABSCESS ANESTHESIA/SEDATION: 2.0 mg IV Versed 100 mcg IV Fentanyl Total Moderate Sedation Time:  23 minutes The patient's level of consciousness and physiologic status were continuously monitored during the procedure by Radiology nursing. PROCEDURE: The procedure, risks, benefits, and alternatives were explained to the patient. Questions regarding the procedure were encouraged and answered. The patient understands and consents to the procedure. A time out was performed prior to initiating the procedure. CT was performed through the abdomen in a supine position. The right anterior abdominal wall was prepped with chlorhexidine in a sterile fashion, and a sterile drape was applied covering the operative field. A sterile gown and sterile gloves were used for the procedure. Local anesthesia was provided with 1% Lidocaine. Under CT guidance, an 18 gauge trocar needle was advanced to the level of a right anterior peritoneal abscess. After confirming needle tip position, a guidewire was advanced and the tract dilated. A 10 French percutaneous drainage catheter was advanced over the wire and formed. A fluid  sample was obtained and sent for culture analysis. The catheter was flushed and connected to a suction bulb. It was secured at the skin with a Prolene retention suture and StatLock device. COMPLICATIONS: None FINDINGS: The right anterior peritoneal abscess yielded purulent bloody fluid. After drain placement there is continued return of fluid. IMPRESSION: CT-guided percutaneous catheter drainage of right anterior peritoneal abscess yielding purulent bloody fluid. A 10 French drainage  catheter was placed and attached to suction bulb drainage. Electronically Signed   By: Irish Lack M.D.   On: 09/08/2020 17:52    Anti-infectives: Anti-infectives (From admission, onward)   Start     Dose/Rate Route Frequency Ordered Stop   09/07/20 1500  piperacillin-tazobactam (ZOSYN) IVPB 3.375 g        3.375 g 12.5 mL/hr over 240 Minutes Intravenous Every 8 hours 09/07/20 1410         Assessment/Plan S/p lap appy for perforated appendicitis on 11/12 by Dr. Magnus Ivan  Intra-abdominal abscesses - S/p IR drainage, 12/8, by Dr. Fredia Sorrow, of right mid anterior peritoneal abscess yieldeding purulent, bloody fluid. Cont drain and flushes per IR. - Cont IV abx. Follow Cx. Currently growing gram + cocci in pairs.  - RLQ and pelvic collections were not amenable to percutaneous drainage. Will need close monitoring over the next few days to ensure does not need additional CT scan, additional drainage vs washout. Hopefully this will improve with non-operative management.  - Will leave on CLD today - Trend WBC, AM labs pending - Mobilize (halls, oob chair), pulm toilet  FEN - CLD, IVF VTE -SCDs, Lovenox  ID -Zosyn 12/7 >> WBC pending this AM, afebrile Foley - None   LOS: 2 days    Jacinto Halim , [facility] 09/09/2020, 7:57 AM Please see Amion for pager number during day hours 7:00am-4:30pm

## 2020-09-10 LAB — CBC
HCT: 29.6 % — ABNORMAL LOW (ref 39.0–52.0)
Hemoglobin: 9.7 g/dL — ABNORMAL LOW (ref 13.0–17.0)
MCH: 25.9 pg — ABNORMAL LOW (ref 26.0–34.0)
MCHC: 32.8 g/dL (ref 30.0–36.0)
MCV: 79.1 fL — ABNORMAL LOW (ref 80.0–100.0)
Platelets: 198 10*3/uL (ref 150–400)
RBC: 3.74 MIL/uL — ABNORMAL LOW (ref 4.22–5.81)
RDW: 12.7 % (ref 11.5–15.5)
WBC: 7.5 10*3/uL (ref 4.0–10.5)
nRBC: 0 % (ref 0.0–0.2)

## 2020-09-10 NOTE — Progress Notes (Signed)
Subjective: CC: Patient reports right sided and suprapubic abdominal pain are better this morning. He is tolerating cld without n/v. Passing flatus. No bm. Mobilizing in room.   Objective: Vital signs in last 24 hours: Temp:  [97.5 F (36.4 C)-97.7 F (36.5 C)] 97.5 F (36.4 C) (12/10 0553) Pulse Rate:  [63-84] 63 (12/10 0553) Resp:  [16-18] 18 (12/10 0553) BP: (95-97)/(57-68) 95/67 (12/10 0553) SpO2:  [100 %] 100 % (12/10 0553) Last BM Date: 09/06/20  Intake/Output from previous day: 12/09 0701 - 12/10 0700 In: 1505.8 [P.O.:450; I.V.:933.3; IV Piggyback:112.5] Out: 1475 [Urine:1450; Drains:25] Intake/Output this shift: No intake/output data recorded.  PE: Gen: Alert, NAD, pleasant Card: RRR Pulm: CTAB, no W/R/R, effort normal Abd: Soft,ND, RUQ/RLQ/suprapubic abdominal tenderness that is improved from yesterday and without peritonitis. +BS,Incisions with glue intact appears well and are without drainage, bleeding, or signs of infection. IR drain with SS output. 25cc recorded in the last 24 hours.  Ext: No LE edema Psych: A&Ox3  Skin: no rashes noted, warm and dry  Lab Results:  Recent Labs    09/09/20 0826 09/10/20 0049  WBC 8.8 7.5  HGB 10.6* 9.7*  HCT 32.0* 29.6*  PLT 195 198   BMET Recent Labs    09/08/20 0011 09/09/20 0826  NA 131* 134*  K 3.7 4.0  CL 94* 95*  CO2 25 25  GLUCOSE 130* 105*  BUN 10 6  CREATININE 0.78 0.87  CALCIUM 8.3* 8.8*   PT/INR Recent Labs    09/08/20 0011  LABPROT 14.4  INR 1.2   CMP     Component Value Date/Time   NA 134 (L) 09/09/2020 0826   K 4.0 09/09/2020 0826   CL 95 (L) 09/09/2020 0826   CO2 25 09/09/2020 0826   GLUCOSE 105 (H) 09/09/2020 0826   BUN 6 09/09/2020 0826   CREATININE 0.87 09/09/2020 0826   CALCIUM 8.8 (L) 09/09/2020 0826   PROT 7.6 09/06/2020 1600   ALBUMIN 3.5 09/06/2020 1600   AST 19 09/06/2020 1600   ALT 20 09/06/2020 1600   ALKPHOS 55 09/06/2020 1600   BILITOT 0.6  09/06/2020 1600   GFRNONAA >60 09/09/2020 0826   GFRAA >60 08/08/2018 0336   Lipase     Component Value Date/Time   LIPASE 21 09/06/2020 1600       Studies/Results: CT IMAGE GUIDED DRAINAGE BY PERCUTANEOUS CATHETER  Result Date: 09/08/2020 CLINICAL DATA:  Postoperative abscesses after prior appendectomy for perforated appendicitis on 08/13/2020. EXAM: CT GUIDED CATHETER DRAINAGE OF PERITONEAL ABSCESS ANESTHESIA/SEDATION: 2.0 mg IV Versed 100 mcg IV Fentanyl Total Moderate Sedation Time:  23 minutes The patient's level of consciousness and physiologic status were continuously monitored during the procedure by Radiology nursing. PROCEDURE: The procedure, risks, benefits, and alternatives were explained to the patient. Questions regarding the procedure were encouraged and answered. The patient understands and consents to the procedure. A time out was performed prior to initiating the procedure. CT was performed through the abdomen in a supine position. The right anterior abdominal wall was prepped with chlorhexidine in a sterile fashion, and a sterile drape was applied covering the operative field. A sterile gown and sterile gloves were used for the procedure. Local anesthesia was provided with 1% Lidocaine. Under CT guidance, an 18 gauge trocar needle was advanced to the level of a right anterior peritoneal abscess. After confirming needle tip position, a guidewire was advanced and the tract dilated. A 10 French percutaneous drainage catheter was advanced over  the wire and formed. A fluid sample was obtained and sent for culture analysis. The catheter was flushed and connected to a suction bulb. It was secured at the skin with a Prolene retention suture and StatLock device. COMPLICATIONS: None FINDINGS: The right anterior peritoneal abscess yielded purulent bloody fluid. After drain placement there is continued return of fluid. IMPRESSION: CT-guided percutaneous catheter drainage of right anterior  peritoneal abscess yielding purulent bloody fluid. A 10 French drainage catheter was placed and attached to suction bulb drainage. Electronically Signed   By: Irish Lack M.D.   On: 09/08/2020 17:52    Anti-infectives: Anti-infectives (From admission, onward)   Start     Dose/Rate Route Frequency Ordered Stop   09/07/20 1500  piperacillin-tazobactam (ZOSYN) IVPB 3.375 g        3.375 g 12.5 mL/hr over 240 Minutes Intravenous Every 8 hours 09/07/20 1410         Assessment/Plan S/p lap appy for perforated appendicitis on 11/12by Dr. Magnus Ivan Intra-abdominal abscesses - S/p IR drainage, 12/8, by Dr. Fredia Sorrow, of right mid anterior peritoneal abscess yieldeding purulent, bloody fluid. Cont drain and flushes per IR. - Cont IV abx. Follow Cx. Currently growing gram + cocci in pairs.  - RLQ and pelvic collections were not amenable to percutaneous drainage. Will need close monitoring over the next few days to ensure does not need additional CT scan, additional drainage vs washout. Patient wbc has normalized, he is afebrile without tachycardia and his abdominal pain is improving. Hopefully he will continue to improve with non-operative management.  - AM labs  - Mobilize (halls, oob chair), pulm toilet  FEN - FLD, IVF VTE -SCDs, Lovenox  ID -Zosyn12/7 >> WBC 7.5, afebrile  Foley - None   LOS: 3 days    Jacinto Halim , [facility] 09/10/2020, 7:46 AM Please see Amion for pager number during day hours 7:00am-4:30pm

## 2020-09-10 NOTE — Plan of Care (Signed)
  Problem: Education: Goal: Knowledge of General Education information will improve Description Including pain rating scale, medication(s)/side effects and non-pharmacologic comfort measures Outcome: Progressing   

## 2020-09-11 LAB — CBC
HCT: 31.6 % — ABNORMAL LOW (ref 39.0–52.0)
Hemoglobin: 10.3 g/dL — ABNORMAL LOW (ref 13.0–17.0)
MCH: 25.9 pg — ABNORMAL LOW (ref 26.0–34.0)
MCHC: 32.6 g/dL (ref 30.0–36.0)
MCV: 79.4 fL — ABNORMAL LOW (ref 80.0–100.0)
Platelets: 229 10*3/uL (ref 150–400)
RBC: 3.98 MIL/uL — ABNORMAL LOW (ref 4.22–5.81)
RDW: 12.7 % (ref 11.5–15.5)
WBC: 3.9 10*3/uL — ABNORMAL LOW (ref 4.0–10.5)
nRBC: 0 % (ref 0.0–0.2)

## 2020-09-11 NOTE — Progress Notes (Signed)
Patient ID: Robert Kent, male   DOB: 1986/04/03, 34 y.o.   MRN: [id number] [facility] Surgery Progress Note:   * No surgery found *  Subjective: Mental status is sleepy but oriented.  Complaints none. Objective: Vital signs in last 24 hours: Temp:  [97.6 F (36.4 C)-97.9 F (36.6 C)] 97.9 F (36.6 C) (12/11 0449) Pulse Rate:  [63-66] 66 (12/11 0449) Resp:  [18] 18 (12/11 0449) BP: (105-109)/(68-72) 105/68 (12/11 0449) SpO2:  [100 %] 100 % (12/11 0449)  Intake/Output from previous day: 12/10 0701 - 12/11 0700 In: 310 [P.O.:300] Out: 2965 [Urine:2950; Drains:15] Intake/Output this shift: No intake/output data recorded.  Physical Exam: Work of breathing is not labored;  Abdomen is flat and nontender; drain in right lower quadrant with serosanguinous drainage.  Lab Results:  Results for orders placed or performed during the hospital encounter of 09/06/20 (from the past 48 hour(s))  CBC     Status: Abnormal   Collection Time: 09/10/20 12:49 AM  Result Value Ref Range   WBC 7.5 4.0 - 10.5 K/uL   RBC 3.74 (L) 4.22 - 5.81 MIL/uL   Hemoglobin 9.7 (L) 13.0 - 17.0 g/dL   HCT 78.4 (L) 69.6 - 29.5 %   MCV 79.1 (L) 80.0 - 100.0 fL   MCH 25.9 (L) 26.0 - 34.0 pg   MCHC 32.8 30.0 - 36.0 g/dL   RDW 28.4 13.2 - 44.0 %   Platelets 198 150 - 400 K/uL   nRBC 0.0 0.0 - 0.2 %    Comment: Performed at [facility], [address], [place], [place] [address]  CBC     Status: Abnormal   Collection Time: 09/11/20  3:33 AM  Result Value Ref Range   WBC 3.9 (L) 4.0 - 10.5 K/uL   RBC 3.98 (L) 4.22 - 5.81 MIL/uL   Hemoglobin 10.3 (L) 13.0 - 17.0 g/dL   HCT 53.6 (L) 64.4 - 03.4 %   MCV 79.4 (L) 80.0 - 100.0 fL   MCH 25.9 (L) 26.0 - 34.0 pg   MCHC 32.6 30.0 - 36.0 g/dL   RDW 74.2 59.5 - 63.8 %   Platelets 229 150 - 400 K/uL   nRBC 0.0 0.0 - 0.2 %    Comment: Performed at [facility], [address], [place], [place] [address]    Radiology/Results: No results  found.  Anti-infectives: Anti-infectives (From admission, onward)   Start     Dose/Rate Route Frequency Ordered Stop   09/07/20 1500  piperacillin-tazobactam (ZOSYN) IVPB 3.375 g        3.375 g 12.5 mL/hr over 240 Minutes Intravenous Every 8 hours 09/07/20 1410        Assessment/Plan: Problem List: Patient Active Problem List   Diagnosis Date Noted   Intra-abdominal fluid collection 09/07/2020   Acute perforated appendicitis 08/12/2020   Dental abscess 08/08/2018   Acute periapical abscess 08/07/2018    Post drainage of intraabdominal abscesses * No surgery found *    LOS: 4 days   Matt B. Daphine Deutscher, MD, [facility] [phone] to reach the surgeon on call.    09/11/2020 9:06 AM

## 2020-09-12 LAB — CBC
HCT: 34.7 % — ABNORMAL LOW (ref 39.0–52.0)
Hemoglobin: 10.8 g/dL — ABNORMAL LOW (ref 13.0–17.0)
MCH: 25.2 pg — ABNORMAL LOW (ref 26.0–34.0)
MCHC: 31.1 g/dL (ref 30.0–36.0)
MCV: 81.1 fL (ref 80.0–100.0)
Platelets: 257 10*3/uL (ref 150–400)
RBC: 4.28 MIL/uL (ref 4.22–5.81)
RDW: 12.9 % (ref 11.5–15.5)
WBC: 4 10*3/uL (ref 4.0–10.5)
nRBC: 0 % (ref 0.0–0.2)

## 2020-09-12 MED ORDER — SODIUM CHLORIDE 0.9 % IV SOLN
2.0000 g | INTRAVENOUS | Status: DC
  Administered 2020-09-12 – 2020-09-13 (×2): 2 g via INTRAVENOUS
  Filled 2020-09-12 (×2): qty 2

## 2020-09-12 NOTE — Telephone Encounter (Signed)
Patient continues inpatient; pain and drain output continuing to improve

## 2020-09-12 NOTE — Plan of Care (Signed)
  Problem: Activity: Goal: Risk for activity intolerance will decrease Outcome: Progressing   Problem: Nutrition: Goal: Adequate nutrition will be maintained Outcome: Progressing   

## 2020-09-12 NOTE — Progress Notes (Signed)
Patient ID: Robert Kent, male   DOB: 1986/07/11, 34 y.o.   MRN: [id number] [facility] Surgery Progress Note:   * No surgery found *  Subjective: Mental status is clear.  Complaints none really.  He is from [place] and [place] is his second language. Objective: Vital signs in last 24 hours: Temp:  [97.6 F (36.4 C)-97.9 F (36.6 C)] 97.7 F (36.5 C) (12/12 0355) Pulse Rate:  [55-65] 55 (12/12 0355) Resp:  [16-19] 16 (12/12 0355) BP: (103-109)/(62-70) 109/70 (12/12 0355) SpO2:  [100 %] 100 % (12/12 0355)  Intake/Output from previous day: 12/11 0701 - 12/12 0700 In: 3203 [P.O.:915; I.V.:2033.9; IV Piggyback:234.1] Out: 2047 [Urine:2025; Drains:22] Intake/Output this shift: No intake/output data recorded.  Physical Exam: Work of breathing is not labored.  JP is serosanguinous.    Lab Results:  Results for orders placed or performed during the hospital encounter of 09/06/20 (from the past 48 hour(s))  CBC     Status: Abnormal   Collection Time: 09/11/20  3:33 AM  Result Value Ref Range   WBC 3.9 (L) 4.0 - 10.5 K/uL   RBC 3.98 (L) 4.22 - 5.81 MIL/uL   Hemoglobin 10.3 (L) 13.0 - 17.0 g/dL   HCT 27.0 (L) 35.0 - 09.3 %   MCV 79.4 (L) 80.0 - 100.0 fL   MCH 25.9 (L) 26.0 - 34.0 pg   MCHC 32.6 30.0 - 36.0 g/dL   RDW 81.8 29.9 - 37.1 %   Platelets 229 150 - 400 K/uL   nRBC 0.0 0.0 - 0.2 %    Comment: Performed at [facility], [address] [place], [place] [address]  CBC     Status: Abnormal   Collection Time: 09/12/20  1:36 AM  Result Value Ref Range   WBC 4.0 4.0 - 10.5 K/uL   RBC 4.28 4.22 - 5.81 MIL/uL   Hemoglobin 10.8 (L) 13.0 - 17.0 g/dL   HCT 93.8 (L) 10.1 - 75.1 %   MCV 81.1 80.0 - 100.0 fL   MCH 25.2 (L) 26.0 - 34.0 pg   MCHC 31.1 30.0 - 36.0 g/dL   RDW 02.5 85.2 - 77.8 %   Platelets 257 150 - 400 K/uL   nRBC 0.0 0.0 - 0.2 %    Comment: Performed at [facility], [address] [place], [place] [address]    Radiology/Results: No results  found.  Anti-infectives: Anti-infectives (From admission, onward)   Start     Dose/Rate Route Frequency Ordered Stop   09/12/20 1300  cefTRIAXone (ROCEPHIN) 2 g in sodium chloride 0.9 % 100 mL IVPB        2 g 200 mL/hr over 30 Minutes Intravenous Every 24 hours 09/12/20 0756     09/07/20 1500  piperacillin-tazobactam (ZOSYN) IVPB 3.375 g  Status:  Discontinued        3.375 g 12.5 mL/hr over 240 Minutes Intravenous Every 8 hours 09/07/20 1410 09/12/20 0756      Assessment/Plan: Problem List: Patient Active Problem List   Diagnosis Date Noted  . Intra-abdominal fluid collection 09/07/2020  . Acute perforated appendicitis 08/12/2020  . Dental abscess 08/08/2018  . Acute periapical abscess 08/07/2018    Drainage decreased-drain has been in 4 days.  Would consider repeat CT scan.  Clinically improved.   * No surgery found *    LOS: 5 days   Matt B. Daphine Deutscher, MD, [facility] [phone] to reach the surgeon on call.  09/12/2020 9:10 AM.

## 2020-09-13 LAB — CBC WITH DIFFERENTIAL/PLATELET
Abs Immature Granulocytes: 0.01 10*3/uL (ref 0.00–0.07)
Basophils Absolute: 0 10*3/uL (ref 0.0–0.1)
Basophils Relative: 0 %
Eosinophils Absolute: 0.3 10*3/uL (ref 0.0–0.5)
Eosinophils Relative: 8 %
HCT: 33.2 % — ABNORMAL LOW (ref 39.0–52.0)
Hemoglobin: 11 g/dL — ABNORMAL LOW (ref 13.0–17.0)
Immature Granulocytes: 0 %
Lymphocytes Relative: 41 %
Lymphs Abs: 1.8 10*3/uL (ref 0.7–4.0)
MCH: 26.1 pg (ref 26.0–34.0)
MCHC: 33.1 g/dL (ref 30.0–36.0)
MCV: 78.9 fL — ABNORMAL LOW (ref 80.0–100.0)
Monocytes Absolute: 0.2 10*3/uL (ref 0.1–1.0)
Monocytes Relative: 5 %
Neutro Abs: 2 10*3/uL (ref 1.7–7.7)
Neutrophils Relative %: 46 %
Platelets: 283 10*3/uL (ref 150–400)
RBC: 4.21 MIL/uL — ABNORMAL LOW (ref 4.22–5.81)
RDW: 13 % (ref 11.5–15.5)
WBC: 4.3 10*3/uL (ref 4.0–10.5)
nRBC: 0 % (ref 0.0–0.2)

## 2020-09-13 LAB — AEROBIC/ANAEROBIC CULTURE W GRAM STAIN (SURGICAL/DEEP WOUND)

## 2020-09-13 MED ORDER — AMOXICILLIN-POT CLAVULANATE 875-125 MG PO TABS
1.0000 | ORAL_TABLET | Freq: Two times a day (BID) | ORAL | Status: DC
  Administered 2020-09-13 – 2020-09-14 (×2): 1 via ORAL
  Filled 2020-09-13 (×2): qty 1

## 2020-09-13 MED ORDER — METHOCARBAMOL 500 MG PO TABS
500.0000 mg | ORAL_TABLET | Freq: Three times a day (TID) | ORAL | Status: DC
  Administered 2020-09-13 – 2020-09-14 (×4): 500 mg via ORAL
  Filled 2020-09-13 (×4): qty 1

## 2020-09-13 MED ORDER — SACCHAROMYCES BOULARDII 250 MG PO CAPS
250.0000 mg | ORAL_CAPSULE | Freq: Two times a day (BID) | ORAL | Status: DC
  Administered 2020-09-13 – 2020-09-14 (×3): 250 mg via ORAL
  Filled 2020-09-13 (×3): qty 1

## 2020-09-13 NOTE — Telephone Encounter (Signed)
White count now normal, had BM and advancing diet and trying to switch from IV to PO antibiotics.  CT still pending.

## 2020-09-13 NOTE — Progress Notes (Signed)
    CC: Abdominal pain  Subjective: Still having some abdominal discomfort but seems better.  He is tolerating full liquids well and is asking about a more regular diet.  He is having bowel movements.  Drainage from the JP appears serous.  Objective: Vital signs in last 24 hours: Temp:  [97.8 F (36.6 C)-98.2 F (36.8 C)] 98 F (36.7 C) (12/13 0508) Pulse Rate:  [52-79] 79 (12/13 0508) Resp:  [16-18] 16 (12/13 0508) BP: (96-100)/(57-64) 100/64 (12/13 0508) SpO2:  [100 %] 100 % (12/13 0508) Last BM Date: 09/12/20 870 p.o. 2300 IV 2750 urine Drain 17 Stool x1 Afebrile vital signs are stable WBC 4.3 CT 12/7 IR drain 12/8  Intake/Output from previous day: 12/12 0701 - 12/13 0700 In: 3165.1 [P.O.:870; I.V.:2035.1; IV Piggyback:250] Out: 2767 [Urine:2750; Drains:17] Intake/Output this shift: No intake/output data recorded.  General appearance: alert, cooperative and no distress Resp: clear to auscultation bilaterally GI: Soft, sore, port sites looks fine.  Drain is clear serosanguineous fluid.  Positive BM  Lab Results:  Recent Labs    09/12/20 0136 09/13/20 0245  WBC 4.0 4.3  HGB 10.8* 11.0*  HCT 34.7* 33.2*  PLT 257 283    BMET No results for input(s): NA, K, CL, CO2, GLUCOSE, BUN, CREATININE, CALCIUM in the last 72 hours. PT/INR No results for input(s): LABPROT, INR in the last 72 hours.  Recent Labs  Lab 09/06/20 1600  AST 19  ALT 20  ALKPHOS 55  BILITOT 0.6  PROT 7.6  ALBUMIN 3.5     Lipase     Component Value Date/Time   LIPASE 21 09/06/2020 1600     Medications: . acetaminophen  1,000 mg Oral Q6H  . docusate sodium  100 mg Oral BID  . enoxaparin (LOVENOX) injection  40 mg Subcutaneous Q24H  . influenza vac split quadrivalent PF  0.5 mL Intramuscular Tomorrow-1000  . sodium chloride flush  5 mL Intracatheter Q8H   . cefTRIAXone (ROCEPHIN)  IV 2 g (09/12/20 1319)  . dextrose 5 % and 0.45 % NaCl with KCl 20 mEq/L 100 mL/hr at 09/13/20  0752  . methocarbamol (ROBAXIN) IV 500 mg (09/13/20 0527)   Organism ID, Bacteria STREPTOCOCCUS INTERMEDIUS   Resulting Agency [facility]      Susceptibility  09/08/20 abscess drainage culture Streptococcus intermedius    MIC    CEFTRIAXONE 0.25 SENSIT... Sensitive    ERYTHROMYCIN <=0.12 SENS... Sensitive    LEVOFLOXACIN 0.5 SENSITIVE  Sensitive    PENICILLIN <=0.06 SENS... Sensitive    VANCOMYCIN 0.5 SENSITIVE  Sensitive                  Assessment/Plan BMI 21.2  Perforated appendectomy Laparoscopic appendectomy 08/13/2020, DR. Abigail Miyamoto  - Postop intra-abdominal abscesses; IR drain placement 09/18/2020 Dr.         Fredia Sorrow  (5 days since last CT)  -Right lower quadrant/pelvic collections not amenable to IR  -WBC 11.9>> 12.4>> 8.8>> 4.0>> 4.3  FEN: IV fluids/full liquids ID: Zosyn 12/7-12/12;  ceftriaxone 12/12>> day 2 DVT: Lovenox Follow-up: Dr. Magnus Ivan Pain: Tylenol 1 g x 4; Robaxin 500 mg every 8;  Plan: I am going to advance him to a soft diet.  Continue current antibiotics.  Mobilize, discuss timing for repeat CT; transition to PO antibiotics.   LOS: 6 days    Tangee Marszalek 09/13/2020 Please see Amion

## 2020-09-13 NOTE — Progress Notes (Signed)
    Referring Physician(s): Dr Sheron Nightingale  Supervising Physician: Gilmer Mor  Patient Status:  [facility] - In-pt  Chief Complaint:  RLQ abscess  Subjective:  Drain placed in IR 12/8 Perforated appendix  Pt feeling some better--- drowsy; groggy in bed Communicating well  Pain at site OP minimal   Allergies: Patient has no known allergies.  Medications: Prior to Admission medications   Medication Sig Start Date End Date Taking? Authorizing Provider  acetaminophen (TYLENOL) 325 MG tablet Take 325 mg by mouth every 6 (six) hours as needed for mild pain or headache.   Yes [provider]  polyethylene glycol (MIRALAX / GLYCOLAX) 17 g packet Take 17 g by mouth daily as needed for mild constipation.   Yes [provider]  HYDROcodone-acetaminophen (NORCO/VICODIN) 5-325 MG tablet Take 1 tablet by mouth every 6 (six) hours as needed for moderate pain. Patient not taking: Reported on 09/07/2020 08/14/20   Luretha Murphy, MD     Vital Signs: BP 100/64 (BP Location: Right Arm)   Pulse 79   Temp 98 F (36.7 C) (Oral)   Resp 16   Ht 5\' 8"  (1.727 m)   Wt 139 lb 15.9 oz (63.5 kg)   SpO2 100%   BMI 21.29 kg/m   Physical Exam Vitals reviewed.  Skin:    General: Skin is warm.     Comments: Site is clean and dry Sl tender to touch OP serous color  Organism ID, Bacteria STREPTOCOCCUS INTERMEDIUS    Neurological:     Mental Status: He is alert.     Imaging: No results found.  Labs:  CBC: Recent Labs    09/10/20 0049 09/11/20 0333 09/12/20 0136 09/13/20 0245  WBC 7.5 3.9* 4.0 4.3  HGB 9.7* 10.3* 10.8* 11.0*  HCT 29.6* 31.6* 34.7* 33.2*  PLT 198 229 257 283    COAGS: Recent Labs    09/08/20 0011  INR 1.2    BMP: Recent Labs    08/13/20 0148 09/06/20 1600 09/08/20 0011 09/09/20 0826  NA 137 132* 131* 134*  K 3.6 4.1 3.7 4.0  CL 102 93* 94* 95*  CO2 26 24 25 25   GLUCOSE 112* 94 130* 105*  BUN 12 11 10 6   CALCIUM 8.7* 9.1 8.3*  8.8*  CREATININE 1.02 0.83 0.78 0.87  GFRNONAA >60 >60 >60 >60    LIVER FUNCTION TESTS: Recent Labs    08/12/20 1518 09/06/20 1600  BILITOT 1.0 0.6  AST 18 19  ALT 15 20  ALKPHOS 54 55  PROT 7.5 7.6  ALBUMIN 4.1 3.5    Assessment and Plan:  RLQ abscess drain intact Flushes easily per RN OP minimal for few days Plan per [facility] If home with drain -- will need IR Clinic follow up scheduled--- let know   Electronically Signed: 13/11/21, PA-C 09/13/2020, 9:31 AM   I spent a total of 15 Minutes at the the patient's bedside AND on the patient's hospital floor or unit, greater than 50% of which was counseling/coordinating care for abscess drain    Patient ID: Robert Kent, male   DOB: 1986-09-01, 34 y.o.   MRN: Robert Kent

## 2020-09-14 ENCOUNTER — Other Ambulatory Visit (HOSPITAL_COMMUNITY): Payer: Self-pay | Admitting: General Surgery

## 2020-09-14 LAB — COMPREHENSIVE METABOLIC PANEL
ALT: 170 U/L — ABNORMAL HIGH (ref 0–44)
AST: 166 U/L — ABNORMAL HIGH (ref 15–41)
Albumin: 3 g/dL — ABNORMAL LOW (ref 3.5–5.0)
Alkaline Phosphatase: 64 U/L (ref 38–126)
Anion gap: 11 (ref 5–15)
BUN: 5 mg/dL — ABNORMAL LOW (ref 6–20)
CO2: 24 mmol/L (ref 22–32)
Calcium: 9.1 mg/dL (ref 8.9–10.3)
Chloride: 105 mmol/L (ref 98–111)
Creatinine, Ser: 0.76 mg/dL (ref 0.61–1.24)
GFR, Estimated: >60 mL/min (ref >60)
Glucose, Bld: 92 mg/dL (ref 70–99)
Potassium: 3.5 mmol/L (ref 3.5–5.1)
Sodium: 140 mmol/L (ref 135–145)
Total Bilirubin: 0.7 mg/dL (ref 0.3–1.2)
Total Protein: 6.8 g/dL (ref 6.5–8.1)

## 2020-09-14 LAB — CBC
HCT: 32.7 % — ABNORMAL LOW (ref 39.0–52.0)
Hemoglobin: 10.8 g/dL — ABNORMAL LOW (ref 13.0–17.0)
MCH: 26.1 pg (ref 26.0–34.0)
MCHC: 33 g/dL (ref 30.0–36.0)
MCV: 79 fL — ABNORMAL LOW (ref 80.0–100.0)
Platelets: 287 10*3/uL (ref 150–400)
RBC: 4.14 MIL/uL — ABNORMAL LOW (ref 4.22–5.81)
RDW: 13 % (ref 11.5–15.5)
WBC: 5.7 10*3/uL (ref 4.0–10.5)
nRBC: 0 % (ref 0.0–0.2)

## 2020-09-14 MED ORDER — SACCHAROMYCES BOULARDII 250 MG PO CAPS: ORAL_CAPSULE | ORAL | Status: AC

## 2020-09-14 MED ORDER — ACETAMINOPHEN 500 MG PO TABS: 1000.0000 mg | ORAL_TABLET | Freq: Four times a day (QID) | ORAL | 0 refills | Status: DC | PRN

## 2020-09-14 MED ORDER — SODIUM CHLORIDE 0.9% FLUSH: INTRAVENOUS | 1 refills | Status: AC

## 2020-09-14 MED ORDER — AMOXICILLIN-POT CLAVULANATE 875-125 MG PO TABS: 1.0000 | ORAL_TABLET | Freq: Two times a day (BID) | ORAL | 0 refills | Status: DC

## 2020-09-14 MED FILL — ACETAMINOPHEN 500MG XT STRE: 500 | 5 days supply | Qty: 30 | Fill #0

## 2020-09-14 MED FILL — AMOX-CLAV 875-125 MG TABLET: 875-125 | 7 days supply | Qty: 14 | Fill #0

## 2020-09-14 NOTE — Progress Notes (Signed)
    CC: Abdominal pain  Subjective: Patient is tolerating his diet well.  Is also tolerating Augmentin.  Abdomen soft nondistended.  Objective: Vital signs in last 24 hours: Temp:  [97.5 F (36.4 C)-97.8 F (36.6 C)] 97.6 F (36.4 C) (12/14 0435) Pulse Rate:  [55-66] 55 (12/14 0435) Resp:  [14-18] 18 (12/14 0435) BP: (101-105)/(53-71) 105/71 (12/14 0435) SpO2:  [100 %] 100 % (12/14 0435) Last BM Date: 09/13/20 For 7 days p.o. 816 IV Urine x2 IR drain 10 cc WBC 5.7 H/H 10.8/32.7 Platelets 287,000 AST 166/ALT 170 Afebrile vital signs are stable  Intake/Output from previous day: 12/13 0701 - 12/14 0700 In: 1291.7 [P.O.:470; I.V.:816.7] Out: 10 [Drains:10] Intake/Output this shift: No intake/output data recorded.  General appearance: alert, cooperative and no distress Resp: clear to auscultation bilaterally GI: Soft, he is not really tender little bit sore over the drain site.  No distention or tenderness on palpation.  Bowel function is back to normal.  Lab Results:  Recent Labs    09/13/20 0245 09/14/20 0034  WBC 4.3 5.7  HGB 11.0* 10.8*  HCT 33.2* 32.7*  PLT 283 287    BMET Recent Labs    09/14/20 0034  NA 140  K 3.5  CL 105  CO2 24  GLUCOSE 92  BUN 5*  CREATININE 0.76  CALCIUM 9.1   PT/INR No results for input(s): LABPROT, INR in the last 72 hours.  Recent Labs  Lab 09/14/20 0034  AST 166*  ALT 170*  ALKPHOS 64  BILITOT 0.7  PROT 6.8  ALBUMIN 3.0*     Lipase     Component Value Date/Time   LIPASE 21 09/06/2020 1600     Medications: . acetaminophen  1,000 mg Oral Q6H  . amoxicillin-clavulanate  1 tablet Oral Q12H  . docusate sodium  100 mg Oral BID  . enoxaparin (LOVENOX) injection  40 mg Subcutaneous Q24H  . influenza vac split quadrivalent PF  0.5 mL Intramuscular Tomorrow-1000  . methocarbamol  500 mg Oral TID  . saccharomyces boulardii  250 mg Oral BID  . sodium chloride flush  5 mL Intracatheter Q8H     Assessment/Plan BMI 21.2  Perforated appendectomy Laparoscopic appendectomy 08/13/2020, DR. Abigail Miyamoto  - Postop intra-abdominal abscesses; IR drain placement 09/18/2020 Dr.         Fredia Sorrow  (5 days since last CT)  -Right lower quadrant/pelvic collections not amenable to IR  -WBC 11.9>> 12.4>> 8.8>> 4.0>> 4.3>>5.7  FEN: IV fluids/full liquids ID: Zosyn 12/7-12/12;  ceftriaxone 12/12>> day 2 DVT: Lovenox Follow-up: Dr. Magnus Ivan Pain: Tylenol 1 g x 4; Robaxin 500 mg every 8;   Plan: Plan to discharge home later today on 7 more days of antibiotics.  We also started him on a probiotic yesterday.  However radiology now is going home so they can set up outpatient follow-up.    LOS: 7 days    Alieah Brinton 09/14/2020 Please see Amion

## 2020-09-14 NOTE — Progress Notes (Signed)
Patient ID: Robert Kent, male   DOB: 1986-01-15, 34 y.o.   MRN: [id number]   Discharging today per [facility]  Pt should flush drain daily 5 cc sterile saline Record OP  He will hear from IR OP Clinic for follow up appt  Please give Rx for sterile saline flushes at DC

## 2020-09-14 NOTE — Progress Notes (Signed)
Patient discharged to home with instructions and return  demonstrated JP care, sent with some supplies.

## 2020-09-14 NOTE — Discharge Summary (Signed)
Physician Discharge Summary  Patient ID: Robert Kent MRN: [id number] DOB/AGE: Mar 03, 1986 34 y.o.  Admit date: 09/06/2020 Discharge date: 09/14/2020  Admission Diagnoses:  Intra-abdominal abscess S/P laparoscopic appendectomy for perforated appendicitis 08/13/2020 Dr. Abigail Miyamoto   Discharge Diagnoses:  Same  Active Problems:   Intra-abdominal fluid collection   PROCEDURES: IR drain placement right anterior peritoneal abscess 09/08/2020  Hospital Course:  This is a 34 yo male who underwent a lap appy by Dr. Magnus Ivan on 08/13/20 for perforated appendicitis with phlegmon.  He tolerated the procedure well and was discharged home the following day on oral augmentin.  He initially did well at home, but had issues with constipation.  He then started having pain about 3-4 days ago in the RUQ and RLQ of his abdomen.  He has some pelvic pain with urination. Also reports early satiety/decreased appetite. States his last BM was this am after taking miralax.  He denies fevers, chills, CP, SOB.  Because his pain continued to worsen, he presented to the ED today where he was found to have a couple of intra-abdominal fluid collections c/w abscesses.  His WBC is mildly elevated 11.9.    He was seen in the emergency department by Dr. Manus Rudd and admitted.    CT showed inflammatory changes in the pelvis and right side of the abdomen with abscess collections.  Irregular abscess in the anterior abdomen measured 4.2 cm with surrounding inflammatory changes.  A second abscess was anterior to the rectum measuring 3.8 cm.  There is also inflammatory changes in the right lower quadrant associated with small bowel difficult to exclude an interloop abscess in the right lower quadrant.  There was wall thickening involving the rectum and the sigmoid which was thought to be adjacent to the abscess collection.  Placed on IV antibiotics, bowel rest, and IV fluid rehydration.  He was seen by IR and underwent  percutaneous drainage of one of his abscesses.  He was initially treated with IV Zosyn 12/7-12/09/2020.  With sensitivities changes he was switched over to IV ceftriaxone on 09/12/2020.  On 12/13 he was converted to Augmentin.  He was advanced to a soft diet.  He is tolerated this well with his white count remaining normal.  The drainage from his IR drain has been clear serosanguineous, and is down to 10 cc today.   We plan to send him home on 7 more days of Augmentin.  He will undergo drain teaching prior to discharge.  IR will arrange follow-up CT scan.  He has instructions to irrigate his drain twice a day, he is also to record the drainage each day and the color of the drainage.  He has follow-up arranged with Dr. Magnus Ivan as noted below.  Condition on discharge: Improved.  CBC Latest Ref Rng & Units 09/14/2020 09/13/2020 09/12/2020  WBC 4.0 - 10.5 K/uL 5.7 4.3 4.0  Hemoglobin 13.0 - 17.0 g/dL 10.8(L) 11.0(L) 10.8(L)  Hematocrit 39.0 - 52.0 % 32.7(L) 33.2(L) 34.7(L)  Platelets 150 - 400 K/uL 287 283 257   CMP Latest Ref Rng & Units 09/14/2020 09/09/2020 09/08/2020  Glucose 70 - 99 mg/dL 92 606(T) 016(W)  BUN 6 - 20 mg/dL 5(L) 6 10  Creatinine 1.09 - 1.24 mg/dL 3.23 5.57 3.22  Sodium 135 - 145 mmol/L 140 134(L) 131(L)  Potassium 3.5 - 5.1 mmol/L 3.5 4.0 3.7  Chloride 98 - 111 mmol/L 105 95(L) 94(L)  CO2 22 - 32 mmol/L 24 25 25   Calcium 8.9 - 10.3 mg/dL 9.1  8.8(L) 8.3(L)  Total Protein 6.5 - 8.1 g/dL 6.8 - -  Total Bilirubin 0.3 - 1.2 mg/dL 0.7 - -  Alkaline Phos 38 - 126 U/L 64 - -  AST 15 - 41 U/L 166(H) - -  ALT 0 - 44 U/L 170(H) - -    CT scan 09/07/20:  IMPRESSION: 1. Inflammatory changes in the pelvis and right side of the abdomen with abscess collections. Irregular abscess in the anterior right abdomen that measures up to 4.2 cm. Surrounding inflammatory changes in the right anterior abdomen and along the inferior aspect of the liver. Second abscess in the pelvis anterior to  the rectum, measures up to 3.8 cm. 2. Inflammatory changes in the right lower quadrant associated with small bowel. Difficult to exclude an interloop abscess in the right lower quadrant. 3. Wall thickening involving the rectum and sigmoid colon is likely related to the adjacent abscess.  Disposition: Discharge disposition: 01-Home or Self Care        Allergies as of 09/14/2020   No Known Allergies     Medication List    TAKE these medications   acetaminophen 500 MG tablet Commonly known as: TYLENOL Take 2 tablets (1,000 mg total) by mouth every 6 (six) hours as needed. What changed:   medication strength  how much to take  reasons to take this   amoxicillin-clavulanate 875-125 MG tablet Commonly known as: AUGMENTIN Take 1 tablet by mouth every 12 (twelve) hours.   HYDROcodone-acetaminophen 5-325 MG tablet Commonly known as: NORCO/VICODIN Take 1 tablet by mouth every 6 (six) hours as needed for moderate pain.   polyethylene glycol 17 g packet Commonly known as: MIRALAX / GLYCOLAX Take 17 g by mouth daily as needed for mild constipation.   saccharomyces boulardii 250 MG capsule Commonly known as: FLORASTOR This is to replace the good bacteria in your colon.  You can buy it at any drug store, it will be with the stool medicines.  It doesn't matter which ope you buy.  Follow package instructions.  Take it for the next 28 days.   sodium chloride flush 0.9 % Soln Commonly known as: NS Use to irrigate catheter twice a day, as instructed.       Follow-up Information    [facility]. Schedule an appointment as soon as possible for a visit.   Contact information: [address] [place] [address] [place] [address] [phone]       Abigail Miyamoto, MD Follow up on 10/04/2020.   Specialty: General Surgery Why: Your appointment is at 9:50 AM.  Be at the office 30 minutes early for check in.  Bring photo ID and insurance  information with you.   Contact information: [address] [place] [address] [phone]               Signed: Sherrie George 09/14/2020, 9:30 AM

## 2020-09-14 NOTE — Discharge Instructions (Signed)
Appendicitis, Adult Your appendix perforated and has resulted in the fluid collections/abscess's you have developed.  Call if you have fever, increased pain, nausea, vomiting, can't eat, trouble with bowel movements.  The appendix is a tube in the body that is shaped like a finger. It is attached to the large intestine. Appendicitis means that this tube is swollen (inflamed). If this is not treated, the tube can tear (rupture). This can lead to a life-threatening infection. This condition can also cause pus to build up in the appendix (abscess). What are the causes? This condition may be caused by something that blocks the appendix. These include:  A ball of poop (stool).  Lymph glands that are bigger than normal. Sometimes the cause is not known. What increases the risk? You are more likely to develop this condition if you are between 95 and 50 years of age. What are the signs or symptoms? Symptoms of this condition include:  Pain around the belly button (navel). ? The pain moves toward the lower right belly (abdomen). ? The pain can get worse with time. ? The pain can get worse if you cough. ? The pain can get worse if you move suddenly.  Tenderness in the lower right belly.  Feeling sick to your stomach (nauseous).  Throwing up (vomiting).  Not feeling hungry (loss of appetite).  A fever.  Having trouble pooping (constipation).  Watery poop (diarrhea).  Not feeling well. How is this treated? Most often, this condition is treated by taking out the appendix (appendectomy). There are two ways to do this:  Open surgery. For this method, the appendix is taken out through a large cut (incision). The cut is made in the lower right belly. This surgery may be used if: ? You have scars from another surgery. ? You have a bleeding condition. ? You are pregnant and will be having your baby soon. ? You have a condition that makes it hard to do the other type of  surgery.  Laparoscopic surgery. For this method, the appendix is taken out through small cuts. Often, this surgery: ? Causes less pain. ? Causes fewer problems. ? Is easier to heal from. If your appendix tears and pus forms:  A drain may be put into the sore. The drain will be used to get rid of the pus.  You may get an antibiotic medicine through an IV line.  Your appendix may or may not need to be taken out. Follow these instructions at home: If you had surgery, follow instructions from your doctor on how to care for yourself at home and how to take care of your cut from surgery. Medicines  Take over-the-counter and prescription medicines only as told by your doctor.  If you were prescribed an antibiotic medicine, take it as told by your doctor. Do not stop taking the antibiotic even if you start to feel better. Eating and drinking Follow instructions from your doctor about what you cannot eat or drink. You may go back to your diet slowly if:  You no longer feel sick to your stomach.  You have stopped throwing up. General instructions  Do not use any products that contain nicotine or tobacco, such as cigarettes, e-cigarettes, and chewing tobacco. If you need help quitting, ask your doctor.  Do not drive or use heavy machinery while taking prescription pain medicine.  Ask your doctor if the medicine you are taking can cause trouble pooping. You may need to take steps to prevent or treat trouble  pooping: ? Drink enough fluid to keep your pee (urine) pale yellow. ? Take over-the-counter or prescription medicines. ? Eat foods that are high in fiber. These include beans, whole grains, and fresh fruits and vegetables. ? Limit foods that are high in fat and sugar. These include fried or sweet foods.  Keep all follow-up visits as told by your doctor. This is important. Contact a doctor if:  There is pus, blood, or a lot of fluid coming from your cut or cuts from surgery.  You are  sick to your stomach or you throw up. Get help right away if:  You have pain in your belly, and the pain is getting worse.  You have a fever.  You have chills.  You are very tired.  You have muscle pain.  You are short of breath. Summary  Appendicitis is swelling of the appendix. The appendix is a tube that is shaped like a finger. It is joined to the large intestine.  This condition may be caused by something that blocks the appendix. This can lead to an infection.  This condition is usually treated by taking out the appendix. This information is not intended to replace advice given to you by your health care provider. Make sure you discuss any questions you have with your health care provider. Document Revised: 03/06/2018 Document Reviewed: 03/06/2018 Elsevier Patient Education  2020 [employer]    Percutaneous Abscess Drain Irrigate with saline twice a day as instructed.  Record drainage each day, volume and color. Call if you have any issue with the drain. Percutaneous abscess drain is removal of a collection of infected fluid inside the body (abscess). This is done by placing a thin needle under the skin and moving it into the abscess. A small tube (catheter) is inserted during the procedure and left in place for a few days to continue to drain the abscess. Tell a health care provider about:  Any allergies you have.  All medicines you are taking, including vitamins, herbs, eye drops, creams, and over-the-counter medicines.  Any problems you or family members have had with anesthetic medicines.  Any blood disorders you have.  Any surgeries you have had.  Any medical conditions you have.  Whether you are pregnant or may be pregnant.  Any history of tobacco use or smoking. What are the risks? Generally, this is a safe procedure. However, problems may occur, including:  Infection.  Bleeding.  Allergic reaction to medicines or materials used.  Damage to  other structures or organs.  Blockage of the catheter, requiring placement of a new catheter.  A need to repeat the procedure.  Failure of the procedure to drain the abscess completely, requiring an open surgical procedure to drain the abscess. An open procedure is done through a larger incision. What happens before the procedure?  Medicines  Ask your health care provider about: ? Changing or stopping your regular medicines. This is especially important if you are taking diabetes medicines or blood thinners. ? Taking medicines such as aspirin and ibuprofen. These medicines can thin your blood. Do not take these medicines before your procedure if your health care provider instructs you not to. Staying hydrated Follow instructions from your health care provider about hydration, which may include:  Up to 2 hours before the procedure - you may continue to drink clear liquids, such as water, clear fruit juice, black coffee, and plain tea. Eating and drinking restrictions Follow instructions from your health care provider about eating and drinking,  which may include:  8 hours before the procedure - stop eating heavy meals or foods such as meat, fried foods, or fatty foods.  6 hours before the procedure - stop eating light meals or foods, such as toast or cereal.  6 hours before the procedure - stop drinking milk or drinks that contain milk.  2 hours before the procedure - stop drinking clear liquids. General instructions  Plan to have someone take you home from the hospital or clinic.  If you will be going home right after the procedure, plan to have someone with you for 24 hours.  You may have blood tests or urine tests.  You may get a tetanus shot.  You may have imaging tests, such as an ultrasound, to check how large or deep your abscess is. What happens during the procedure?  To lower your risk of infection: ? Your health care team will wash or sanitize their hands. ? The  skin around the abscess will be washed with soap. ? Hair may be removed from the surgical site.  An IV tube will be inserted into one of your veins.  You will be given medicine to numb the area (local anesthetic) where the catheter will be placed. Placement of the catheter varies depending on where your abscess is located.  You may be given medicine to help you relax (sedative) or medicine to make you fall asleep (general anesthetic).  A small incision will be made in your skin.  A needle will be inserted under your skin and moved into the abscess. Images from ultrasound, X-ray, or a CT scan will be used to help guide the needle to the abscess.  A catheter will be inserted into your incision and moved underneath your skin until it reaches the abscess. Images will be used to help guide the catheter to the abscess.  After the catheter is in place, the needle will be removed. The catheter will be connected to a bag outside of your body. The catheter will stay in place until the fluid has stopped draining and the infection is gone. The procedure may vary among health care providers and hospitals. What happens after the procedure?  Your blood pressure, heart rate, breathing rate, and blood oxygen level will be monitored until the medicines you were given have worn off.  You may have some pain or nausea. Medicines will be available to help you. Summary  An abscess is a collection of infected fluid inside the body.  During this procedure, images from ultrasound, X-rays, or a CT scan are used to help guide the needle and catheter to the abscess.  A catheter will be left in place to continue to drain the abscess after the procedure. This information is not intended to replace advice given to you by your health care provider. Make sure you discuss any questions you have with your health care provider. Document Revised: 08/31/2017 Document Reviewed: 08/10/2016 Elsevier Patient Education  2020  [employer].

## 2020-09-14 NOTE — Telephone Encounter (Signed)
Discharged on oral augmentin today with drain care/flushes teaching and follow up scheduled with surgeon and outpatient CT to be arranged.

## 2020-09-14 NOTE — Plan of Care (Signed)
  Problem: Education: Goal: Knowledge of General Education information will improve Description Including pain rating scale, medication(s)/side effects and non-pharmacologic comfort measures Outcome: Progressing   

## 2020-09-15 ENCOUNTER — Other Ambulatory Visit: Payer: Self-pay | Admitting: Surgery

## 2020-09-15 DIAGNOSIS — R188 Other ascites: Secondary | ICD-10-CM

## 2020-09-15 DIAGNOSIS — T8143XA Infection following a procedure, organ and space surgical site, initial encounter: Secondary | ICD-10-CM

## 2020-09-16 NOTE — Telephone Encounter (Signed)
Noted. Also located work note from Care[employer] adviser with anticipated RTW date of 10/05/20.

## 2020-09-16 NOTE — Telephone Encounter (Signed)
Late entry spoke with patient via telephone 09/15/2020.  He reported that he is feeling as good as can be expected performed drain flush without difficulty today.  Taking augmentin po without difficulty, denied fever/chills/n/v/diarrhea.  Procedure 09/22/2020 and surgeon appt 10/05/2020.  No further questions or concerns at this time  CT scan still pending scheduling per patient.  Discussed patient can call [phone] to talk with RN Salina April, Th, F in clinic or email me [email or web address] if questions or concerns.  HR team [employer] notified patient discharged to home 09/14/2020 and pending further appts and follow up do not expect him back to work until January 2022.  Work excuse in epic states 10/05/2020.

## 2020-09-18 NOTE — Telephone Encounter (Signed)
Telephone message left for patient checking in to see if symptoms improving and if any questions or concerns will try to call again tomorrow.

## 2020-09-19 NOTE — Telephone Encounter (Signed)
Patient contacted via telephone stated he has been flushing drain without difficulty feeling better denied fever/chills and pain decreased and only pain now around drain site.  Tolerating po intake without difficulty.  Procedure appt to remove drain 09/22/2020 and will have RN Rolly Salter contact him on Thursday 09/23/2020 to follow up.  Patient verbalized understanding information/instructions, agreed with plan of care and had no further questions at this time.

## 2020-09-21 ENCOUNTER — Other Ambulatory Visit: Payer: Self-pay

## 2020-09-22 ENCOUNTER — Ambulatory Visit
Admission: RE | Admit: 2020-09-22 | Discharge: 2020-09-22 | Disposition: A | Discharge status: Home / Self Care | Payer: Self-pay | Source: Ambulatory Visit | Attending: Radiology | Admitting: Radiology

## 2020-09-22 ENCOUNTER — Ambulatory Visit
Admission: RE | Admit: 2020-09-22 | Discharge: 2020-09-22 | Disposition: A | Discharge status: Home / Self Care | Payer: Self-pay | Source: Ambulatory Visit | Attending: Surgery | Admitting: Surgery

## 2020-09-22 ENCOUNTER — Encounter: Payer: Self-pay | Admitting: *Deleted

## 2020-09-22 DIAGNOSIS — R188 Other ascites: Secondary | ICD-10-CM

## 2020-09-22 DIAGNOSIS — T8143XA Infection following a procedure, organ and space surgical site, initial encounter: Secondary | ICD-10-CM

## 2020-09-22 HISTORY — PX: IR RADIOLOGIST EVAL & MGMT: IMG5224

## 2020-09-22 MED ORDER — IOPAMIDOL (ISOVUE-300) INJECTION 61%
100.0000 mL | Freq: Once | INTRAVENOUS | Status: AC | PRN
  Administered 2020-09-22: 14:00:00 100 mL via INTRAVENOUS

## 2020-09-22 NOTE — Progress Notes (Signed)
Referring Physician(s): Turpin,Pamela  Chief Complaint: The patient is seen in follow up today s/p intra-abdominal fluid collection  History of present illness:  Robert Kent is a 34 year old male with no significant past medical history who underwent lap appendectomy 08/13/20 with Dr. Rayburn Ma.  He recovered well and was discharged home with several days of improvement, however ultimately developed constipation and abdominal pain.  He returned to the [facility] ED 09/07/20 at which time CT Abdomen Pelvis demonstrated an anterior right abdominal abscess as well as a small second abscess anterior to the rectum.  He underwent drainage of his right abdominal abscess 09/08/20 by Dr. Fredia Sorrow. He was discharged home with drain in place.  He returns to IR drain clinic today for evaluation and management of his right anterior drain.  Mr. Coster presents today in his usual state of health.  He reports occasional abdominal pain that is short-lived and non-intrusive.  He has been eating and drinking well per his report.  Denies fever, chills, nausea, vomiting. He reports minimal drainage from his right drain. He completed antibiotics yesterday and plans to follow-up with surgical team in January.   Past Medical History:  Diagnosis Date  . Subperiosteal abscess of jaw 08/07/2018    Past Surgical History:  Procedure Laterality Date  . INCISION AND DRAINAGE ABSCESS N/A 08/09/2018   Procedure: INCISION AND DRAINAGE ABSCESS;  Surgeon: [name], DDS;  Location: [facility] OR;  Service: Oral Surgery;  Laterality: N/A;  . IR RADIOLOGIST EVAL & MGMT  09/22/2020  . LAPAROSCOPIC APPENDECTOMY N/A 08/13/2020   Procedure: APPENDECTOMY LAPAROSCOPIC;  Surgeon: Abigail Miyamoto, MD;  Location: [facility] OR;  Service: General;  Laterality: N/A;  . NO PAST SURGERIES      Allergies: Patient has no known allergies.  Medications: Prior to Admission medications   Medication Sig Start Date End Date Taking? Authorizing Provider   acetaminophen (TYLENOL) 500 MG tablet Take 2 tablets (1,000 mg total) by mouth every 6 (six) hours as needed. 09/14/20   Sherrie George, PA-C  amoxicillin-clavulanate (AUGMENTIN) 875-125 MG tablet Take 1 tablet by mouth every 12 (twelve) hours. 09/14/20   Sherrie George, PA-C  HYDROcodone-acetaminophen (NORCO/VICODIN) 5-325 MG tablet Take 1 tablet by mouth every 6 (six) hours as needed for moderate pain. Patient not taking: Reported on 09/07/2020 08/14/20   Luretha Murphy, MD  polyethylene glycol (MIRALAX / GLYCOLAX) 17 g packet Take 17 g by mouth daily as needed for mild constipation.    [provider]  saccharomyces boulardii (FLORASTOR) 250 MG capsule This is to replace the good bacteria in your colon.  You can buy it at any drug store, it will be with the stool medicines.  It doesn't matter which ope you buy.  Follow package instructions.  Take it for the next 28 days. 09/14/20   Sherrie George, PA-C  sodium chloride flush (NS) 0.9 % SOLN Use to irrigate catheter twice a day, as instructed. 09/14/20   Sherrie George, PA-C     Family History  Family history unknown: Yes    Social History   Socioeconomic History  . Marital status: Single    Spouse name: Not on file  . Number of children: Not on file  . Years of education: Not on file  . Highest education level: Not on file  Occupational History  . Occupation: shipping  Tobacco Use  . Smoking status: Current Every Day Smoker    Packs/day: 0.25    Years: 10.00    Pack years: 2.50  Types: Cigarettes  . Smokeless tobacco: Never Used  Vaping Use  . Vaping Use: Never used  Substance and Sexual Activity  . Alcohol use: Yes    Comment: no daily drinking, drinks a few times a month  . Drug use: No  . Sexual activity: Not on file  Other Topics Concern  . Not on file  Social History Narrative  . Not on file   Social Determinants of Health   Financial Resource Strain: Not on file  Food Insecurity: Not on  file  Transportation Needs: Not on file  Physical Activity: Not on file  Stress: Not on file  Social Connections: Not on file     Vital Signs: There were no vitals taken for this visit.  Physical Exam  NAD, alert Abdomen:  Soft, non-tender.  Right anterior drain in place.  Insertion site c/d/i.  Skin suture remains intact. Dressing clean and dry.  Output minimal, serous.   Imaging: CT ABDOMEN PELVIS W CONTRAST  Result Date: 09/22/2020 CLINICAL DATA:  History of perforated appendicitis and postoperative abscess. CT-guided drain was placed on 09/08/2020. Patient reports minimal output. EXAM: CT ABDOMEN AND PELVIS WITH CONTRAST TECHNIQUE: Multidetector CT imaging of the abdomen and pelvis was performed using the standard protocol following bolus administration of intravenous contrast. CONTRAST:  ISOVUE-300 IOPAMIDOL (ISOVUE-300) INJECTION 61% COMPARISON:  09/07/2020 FINDINGS: Lower chest: Lung bases are clear.  No pleural effusions. Hepatobiliary: Normal appearance of the liver without a discrete lesion. Mild dilatation of left intrahepatic bile ducts are similar to the previous examination. Normal appearance of the gallbladder. Portal venous system is patent. Pancreas: Unremarkable. No pancreatic ductal dilatation or surrounding inflammatory changes. Spleen: Normal in size without focal abnormality. Adrenals/Urinary Tract: Normal appearance of the adrenal glands. Normal appearance of both kidneys without hydronephrosis. Small amount of fluid in the urinary bladder. Stomach/Bowel: A surgical clip along the cecum compatible with an appendectomy. No evidence for bowel inflammation or obstruction. Vascular/Lymphatic: No significant vascular findings are present. No enlarged abdominal or pelvic lymph nodes. Reproductive: Prostate is unremarkable. Other: Previously, there was a complex fluid collection in the pelvis, anterior to the rectum. This fluid collection has almost completely resolved. There  is a small residual collection measuring 1.4 x 1.0 x 2.0 cm on sequence 2, image 69 and previously this area measured 3.3 x 1.6 x 3.8 cm. The right anterior abdominal abscess has resolved since placement of the percutaneous drain. The drain is well positioned in the right anterior abdomen. No new abscess collections. Small amount of material along the inferior aspect of the liver is similar to the previous examination and could represent postinflammatory/postoperative changes. Musculoskeletal: No acute bone abnormality. S1 appears to be a transitional vertebral body with irregularity and Schmorl's nodes along the superior endplate of S1. IMPRESSION: 1. Right upper abdominal abscess has resolved with drain placement. 2. Pelvic abscess has nearly resolved as described. 3. Mild residual inflammatory changes in the right abdomen. 4. No new abscess formations. Electronically Signed   By: Richarda Overlie M.D.   On: 09/22/2020 15:04   IR Radiologist Eval & Mgmt  Result Date: 09/22/2020 Please refer to notes tab for details about interventional procedure. (Op Note)   Labs:  CBC: Recent Labs    09/11/20 0333 09/12/20 0136 09/13/20 0245 09/14/20 0034  WBC 3.9* 4.0 4.3 5.7  HGB 10.3* 10.8* 11.0* 10.8*  HCT 31.6* 34.7* 33.2* 32.7*  PLT 229 257 283 287    COAGS: Recent Labs    09/08/20 0011  INR 1.2    BMP: Recent Labs    09/06/20 1600 09/08/20 0011 09/09/20 0826 09/14/20 0034  NA 132* 131* 134* 140  K 4.1 3.7 4.0 3.5  CL 93* 94* 95* 105  CO2 24 25 25 24   GLUCOSE 94 130* 105* 92  BUN 11 10 6  5*  CALCIUM 9.1 8.3* 8.8* 9.1  CREATININE 0.83 0.78 0.87 0.76  GFRNONAA >60 >60 >60 >60    LIVER FUNCTION TESTS: Recent Labs    08/12/20 1518 09/06/20 1600 09/14/20 0034  BILITOT 1.0 0.6 0.7  AST 18 19 166*  ALT 15 20 170*  ALKPHOS 54 55 64  PROT 7.5 7.6 6.8  ALBUMIN 4.1 3.5 3.0*    Assessment: Intra-abdominal fluid collection s/p lap appendectomy 08/13/20, s/p right anterior drain  placement 09/08/20 by Dr. 13/12/21 Mr. Martino presents to IR drain clinic today in his usual state of health.  He reports he has been recovering well at home with improved symptoms and appetite.  He has been flushing his drain twice daily wihtout issue.  Reports minimal output daily and estimates this is approximately 5 mL daily.  CT Abdomen Pelvis reviewed by Dr. 14/8/21 today who has discussed results with patient.  Given resolve of collection by imaging and no output plan made for drain removal today.  PA removed drain in its entirety and without complication.  Dressing placed.  Care instructions given.  Patient verbalized understanding.  No follow-up with IR needed at this time.   Signed: Fredia Sorrow, PA 09/22/2020, 3:35 PM   Please refer to Dr. Hoyt Koch attestation of this note for management and plan.

## 2020-09-23 NOTE — Telephone Encounter (Signed)
Noted procedure notes from 12/22 from IR that RLQ drain was removed without difficulty after reviewing CT that showed abd abscess resovled and pelvic abscess almost resolved. Notes report pt tolerated procedure well.  Attempted to contact pt to check in. No answer. LVMRCB.

## 2020-09-23 NOTE — Telephone Encounter (Signed)
Reviewed RN Rolly Salter note and contacted patient via telephone feeling better with drain out.  Eating without difficulty and next follow up 04 Oct 2020 with surgeon.  Patient resting and had no further questions or concerns at this time.  Will follow up again next week with patient via telephone.  Patient verbalized understanding information/instructions, agreed with plan of care and had no further questions at this time.

## 2020-10-06 NOTE — Telephone Encounter (Signed)
Reviewed epic no new encounter notes available.  Patient appt 10/04/2020 per last discussion follow up with surgeon.  Called patient and he reported convalescence time extended 3 weeks and paperwork sent to [facility] by provider office.  Patient reported eating and urinating/stooling normally.   A small amount pain still at drain site otherwise doing well.  Notified patient I would update HR that paperwork sent to [facility] by his surgeon to stay at home another 3 weeks per patient.  Patient stated he would follow up with [facility] also.  I will call patient again in 1 week to follow up.  Patient verbalized understanding information/instructions, agreed with plan of care and had no further questions at this time.

## 2020-10-19 NOTE — Telephone Encounter (Signed)
Spoke with patient via telephone and he reported has work excuse through 10/25/2020 and unsure when next follow up appt with surgeon is.  He will call surgeon office today to follow up.  He also reported was feeling sick so had covid tests on 1/10 and 1/11 both positive and has been quarantining at home.  All family members positive but feeling better now.  Day 0 10/11/2020  Day 10 10/21/2020  HR notified.  Patient A&Ox3 respirations even and unlabored RA spoke full sentences without difficulty no cough/throat clearing or nasal sniffing/congestion noted during 5 minute telephone call.  Will follow up with patient next week Monday RN Rolly Salter via telephone to verify if cleared to RTW by surgeon 10/26/2020.  Patient verbalized understanding information/instructions, agreed with plan of care and had no further questions at this time.

## 2020-10-31 NOTE — Telephone Encounter (Signed)
Left message for patient checking in regarding his return to work date from surgeon's office.

## 2020-11-04 NOTE — Telephone Encounter (Signed)
Patient contacted via telephone returned to work on 11/02/2020 and everything going well no questions or concerns at this time.

## 2022-11-04 IMAGING — CT CT ABD-PELV W/ CM
2 of 5 series · 15 of 46 positions shown, 17 images · IV contrast (APPLIED)
Comparison: None.

CLINICAL DATA: Right lower quadrant pain

EXAM:
CT ABDOMEN AND PELVIS WITH CONTRAST
TECHNIQUE: Multidetector CT imaging of the abdomen and pelvis was performed
using the standard protocol following bolus administration of
intravenous contrast.
CONTRAST:  75mL OMNIPAQUE IOHEXOL 350 MG/ML SOLN

[Series 3: abdomen 5.0 · axial · 0.80mm/px · z∈[+935,+1305]mm · 12 of 88 slices shown, 14 images]
[im 7/88  soft-tissue]
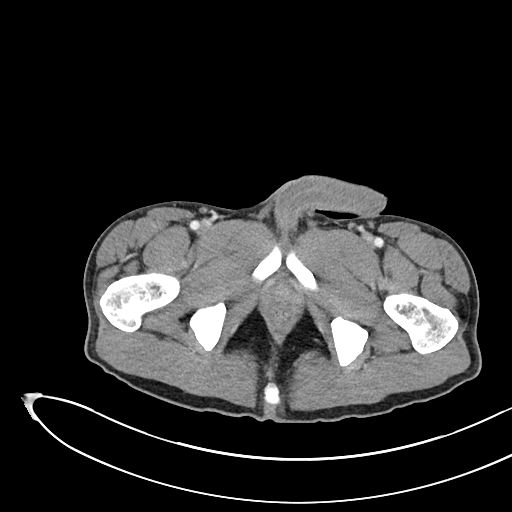
[im 7/88  bone]
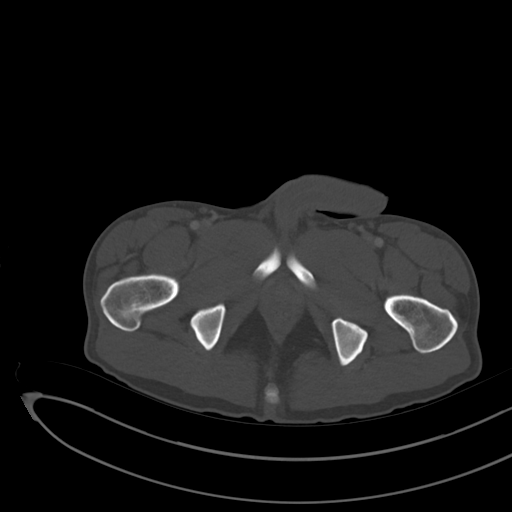
[im 13/88  soft-tissue]
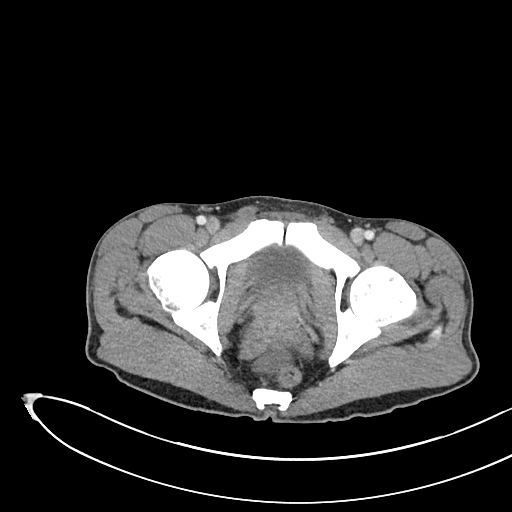
[im 19/88  soft-tissue]
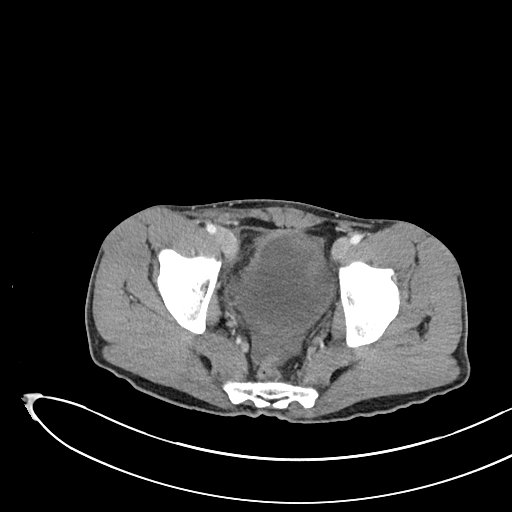
[im 25/88  soft-tissue]
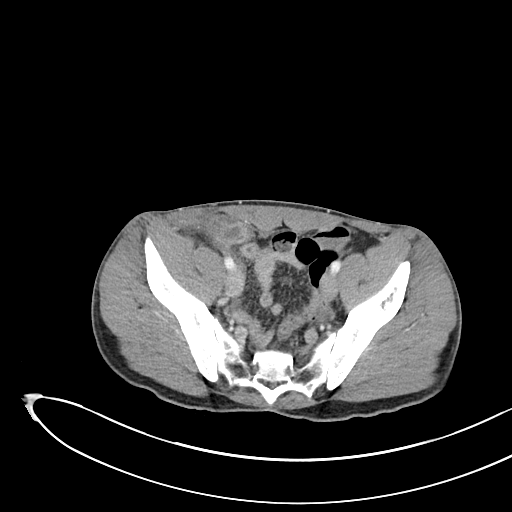
[im 32/88  soft-tissue]
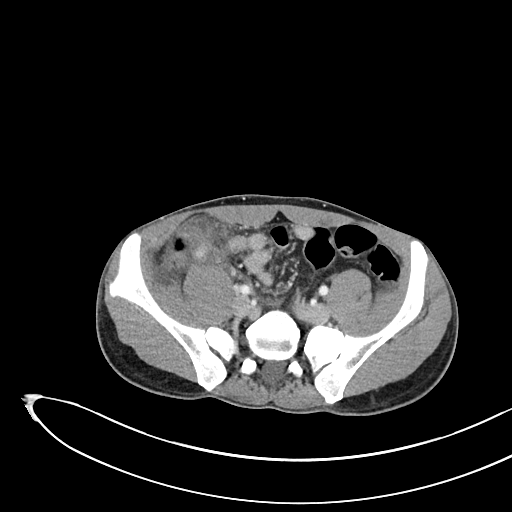
[im 38/88  soft-tissue]
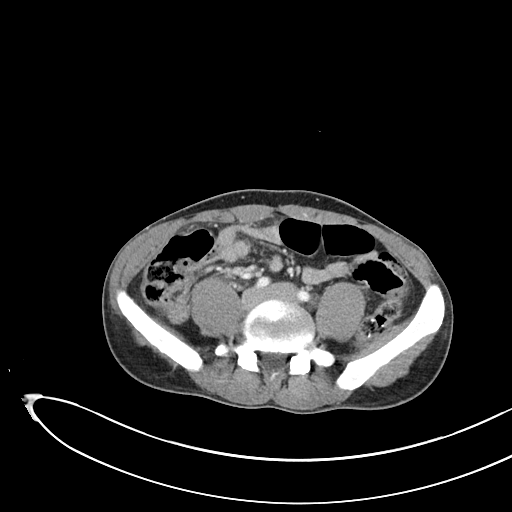
[im 50/88  soft-tissue]
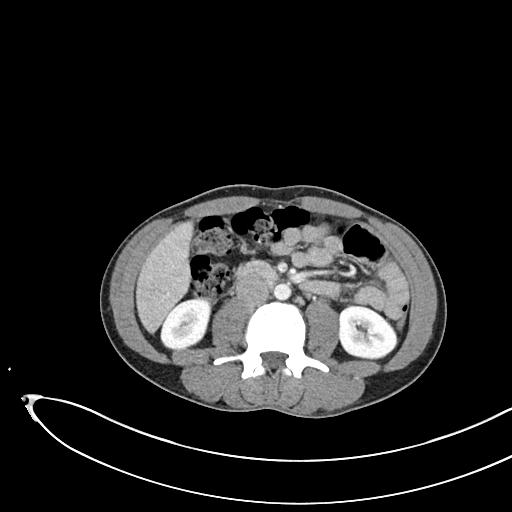
[im 56/88  soft-tissue]
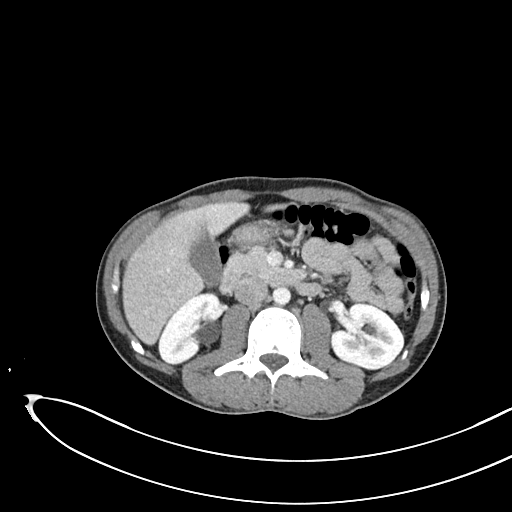
[im 63/88  soft-tissue]
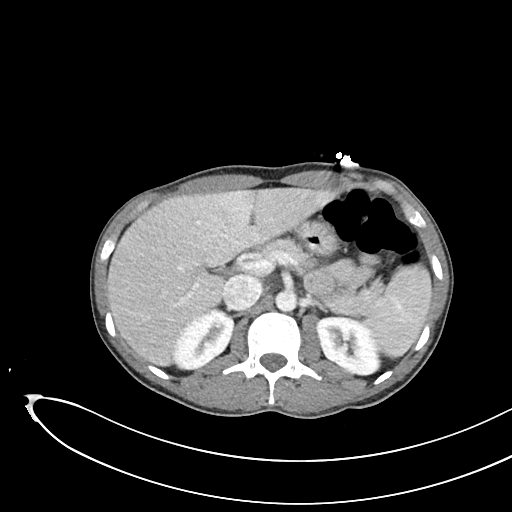
[im 63/88  bone]
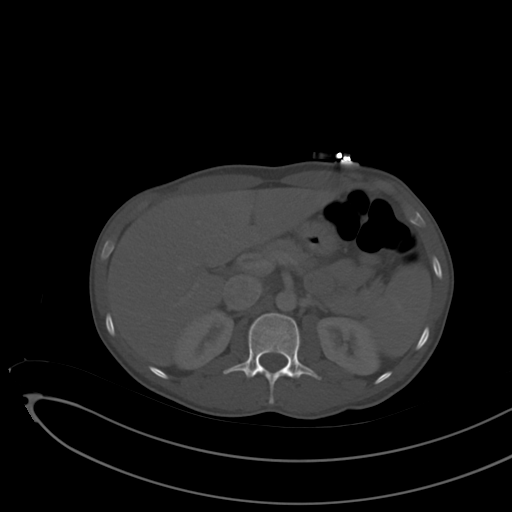
[im 69/88  soft-tissue]
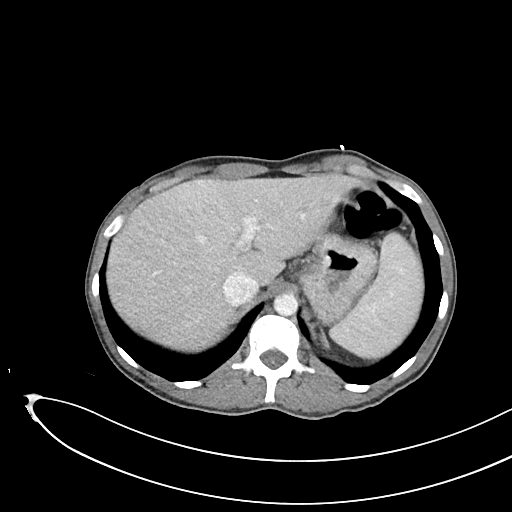
[im 75/88  soft-tissue]
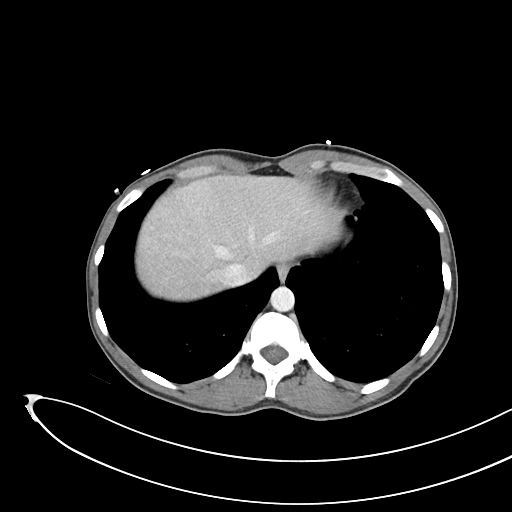
[im 81/88  soft-tissue]
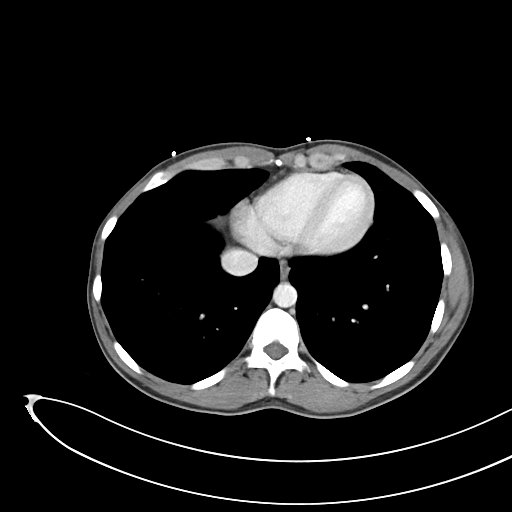

[Series 7: abdomen 3.0 mpr cor · coronal · 0.65mm/px · 3 of 79 slices shown]
[im 27/79  soft-tissue]
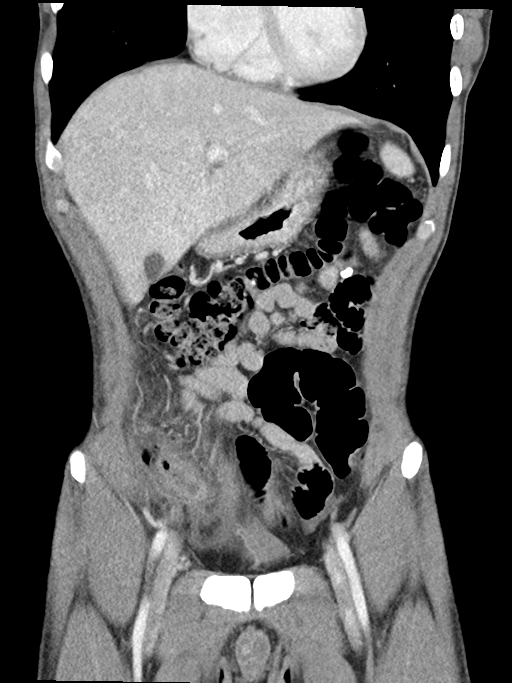
[im 35/79  soft-tissue]
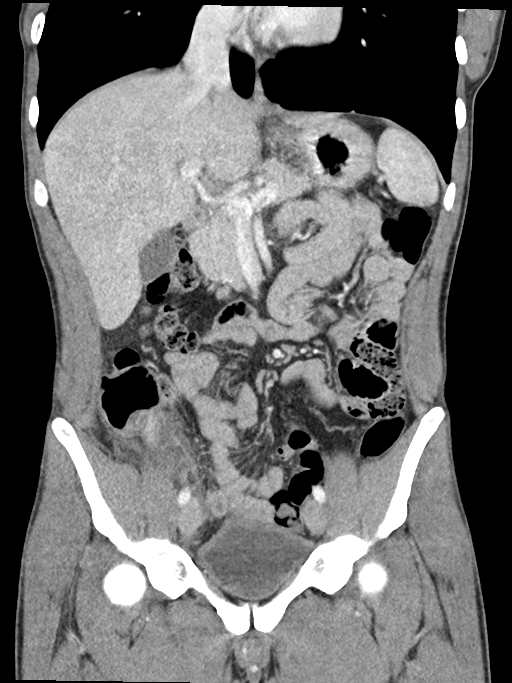
[im 44/79  soft-tissue]
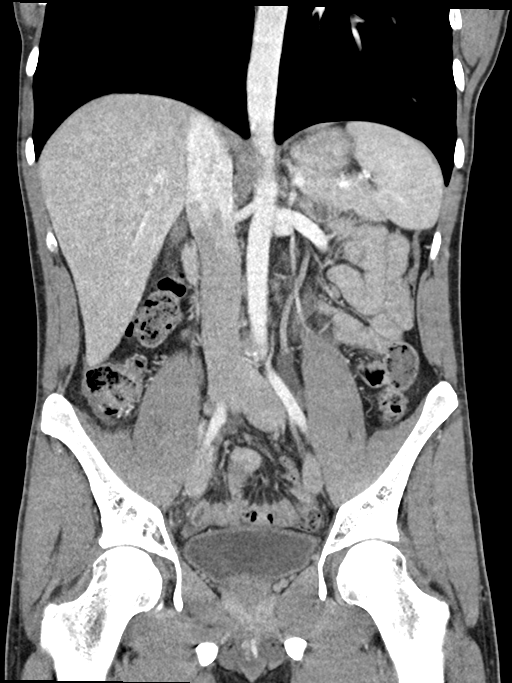

[15 of 46 positions shown; findings below may reference images not displayed]

FINDINGS: Lower chest: No acute pleural or parenchymal lung disease.

Hepatobiliary: No focal liver abnormality is seen. No gallstones,
gallbladder wall thickening, or biliary dilatation.

Pancreas: Unremarkable. No pancreatic ductal dilatation or
surrounding inflammatory changes.

Spleen: Normal in size without focal abnormality.

Adrenals/Urinary Tract: Adrenal glands are unremarkable. Kidneys are
normal, without renal calculi, focal lesion, or hydronephrosis.
Duplicated left ureter incidentally noted. Bladder is unremarkable.

Stomach/Bowel: Inflamed appendix is seen within the right lower
quadrant, measuring up to 2.3 cm in diameter. There is marked
appendiceal wall thickening, with evidence of small perforation and
focus of extraluminal gas. Multiple appendicoliths are identified.
Findings are consistent with acute perforated appendicitis. No fluid
collection or abscess.

No bowel obstruction or ileus.

Vascular/Lymphatic: No significant vascular findings. Small reactive
lymph nodes are seen within the right lower quadrant mesentery. No
pathologic adenopathy.

Reproductive: Prostate is unremarkable.

Other: Small amount of free fluid within the right lower quadrant
and pelvis. No fluid collection or abscess.

As above, small focus of extraluminal gas adjacent to the inflamed
appendix, consistent with perforated appendicitis.

No abdominal wall hernia.

Musculoskeletal: No acute or destructive bony lesions. Reconstructed
images demonstrate no additional findings.
IMPRESSION: 1. Acute perforated appendicitis, with small focus of extraluminal
gas as above. No fluid collection or abscess.
2. Trace free fluid right lower quadrant and pelvis.

Critical Value/emergent results were called by telephone at the time
of interpretation on 08/12/2020 at [DATE] to provider TEN
KAD JAGANJCI , who verbally acknowledged these results.
